# Patient Record
Sex: Female | Born: 1990 | Marital: Married
Health system: Southern US, Community
[De-identification: ages and names within clinical notes are randomized; demographics above are authoritative.]

## PROBLEM LIST (undated history)

## (undated) DIAGNOSIS — I4711 Inappropriate sinus tachycardia, so stated: Secondary | ICD-10-CM

## (undated) DIAGNOSIS — E282 Polycystic ovarian syndrome: Secondary | ICD-10-CM

## (undated) DIAGNOSIS — R000 Tachycardia, unspecified: Secondary | ICD-10-CM

## (undated) DIAGNOSIS — K529 Noninfective gastroenteritis and colitis, unspecified: Secondary | ICD-10-CM

## (undated) HISTORY — PX: WISDOM TOOTH EXTRACTION: SHX21

## (undated) HISTORY — DX: Tachycardia, unspecified: R00.0

## (undated) HISTORY — DX: Polycystic ovarian syndrome: E28.2

## (undated) HISTORY — PX: NASAL SINUS SURGERY: SHX719

## (undated) HISTORY — DX: Noninfective gastroenteritis and colitis, unspecified: K52.9

## (undated) HISTORY — DX: Inappropriate sinus tachycardia, so stated: I47.11

## (undated) HISTORY — PX: TONSILLECTOMY: SUR1361

---

## 2018-09-21 ENCOUNTER — Ambulatory Visit (INDEPENDENT_AMBULATORY_CARE_PROVIDER_SITE_OTHER): Payer: BLUE CROSS/BLUE SHIELD | Admitting: Certified Nurse Midwife

## 2018-09-21 ENCOUNTER — Encounter: Payer: Self-pay | Admitting: Certified Nurse Midwife

## 2018-09-21 ENCOUNTER — Other Ambulatory Visit (HOSPITAL_COMMUNITY)
Admission: RE | Admit: 2018-09-21 | Discharge: 2018-09-21 | Disposition: A | Discharge status: Home / Self Care | Payer: BLUE CROSS/BLUE SHIELD | Source: Ambulatory Visit | Attending: Obstetrics and Gynecology | Admitting: Obstetrics and Gynecology

## 2018-09-21 VITALS — BP 96/55 | HR 69 | Ht 60.0 in | Wt 97.5 lb

## 2018-09-21 DIAGNOSIS — Z01419 Encounter for gynecological examination (general) (routine) without abnormal findings: Secondary | ICD-10-CM | POA: Insufficient documentation

## 2018-09-21 DIAGNOSIS — R000 Tachycardia, unspecified: Secondary | ICD-10-CM | POA: Insufficient documentation

## 2018-09-21 DIAGNOSIS — I4711 Inappropriate sinus tachycardia, so stated: Secondary | ICD-10-CM | POA: Insufficient documentation

## 2018-09-21 NOTE — Patient Instructions (Signed)
Preventive Care 18-39 Years, Female Preventive care refers to lifestyle choices and visits with your health care provider that can promote health and wellness. What does preventive care include?  A yearly physical exam. This is also called an annual well check.  Dental exams once or twice a year.  Routine eye exams. Ask your health care provider how often you should have your eyes checked.  Personal lifestyle choices, including: ? Daily care of your teeth and gums. ? Regular physical activity. ? Eating a healthy diet. ? Avoiding tobacco and drug use. ? Limiting alcohol use. ? Practicing safe sex. ? Taking vitamin and mineral supplements as recommended by your health care provider. What happens during an annual well check? The services and screenings done by your health care provider during your annual well check will depend on your age, overall health, lifestyle risk factors, and family history of disease. Counseling Your health care provider may ask you questions about your:  Alcohol use.  Tobacco use.  Drug use.  Emotional well-being.  Home and relationship well-being.  Sexual activity.  Eating habits.  Work and work Statistician.  Method of birth control.  Menstrual cycle.  Pregnancy history.  Screening You may have the following tests or measurements:  Height, weight, and BMI.  Diabetes screening. This is done by checking your blood sugar (glucose) after you have not eaten for a while (fasting).  Blood pressure.  Lipid and cholesterol levels. These may be checked every 5 years starting at age 66.  Skin check.  Hepatitis C blood test.  Hepatitis B blood test.  Sexually transmitted disease (STD) testing.  BRCA-related cancer screening. This may be done if you have a family history of breast, ovarian, tubal, or peritoneal cancers.  Pelvic exam and Pap test. This may be done every 3 years starting at age 40. Starting at age 59, this may be done every 5  years if you have a Pap test in combination with an HPV test.  Discuss your test results, treatment options, and if necessary, the need for more tests with your health care provider. Vaccines Your health care provider may recommend certain vaccines, such as:  Influenza vaccine. This is recommended every year.  Tetanus, diphtheria, and acellular pertussis (Tdap, Td) vaccine. You may need a Td booster every 10 years.  Varicella vaccine. You may need this if you have not been vaccinated.  HPV vaccine. If you are 69 or younger, you may need three doses over 6 months.  Measles, mumps, and rubella (MMR) vaccine. You may need at least one dose of MMR. You may also need a second dose.  Pneumococcal 13-valent conjugate (PCV13) vaccine. You may need this if you have certain conditions and were not previously vaccinated.  Pneumococcal polysaccharide (PPSV23) vaccine. You may need one or two doses if you smoke cigarettes or if you have certain conditions.  Meningococcal vaccine. One dose is recommended if you are age 27-21 years and a first-year college student living in a residence hall, or if you have one of several medical conditions. You may also need additional booster doses.  Hepatitis A vaccine. You may need this if you have certain conditions or if you travel or work in places where you may be exposed to hepatitis A.  Hepatitis B vaccine. You may need this if you have certain conditions or if you travel or work in places where you may be exposed to hepatitis B.  Haemophilus influenzae type b (Hib) vaccine. You may need this if  you have certain risk factors.  Talk to your health care provider about which screenings and vaccines you need and how often you need them. This information is not intended to replace advice given to you by your health care provider. Make sure you discuss any questions you have with your health care provider. Document Released: 02/02/2002 Document Revised: 08/26/2016  Document Reviewed: 10/08/2015 Elsevier Interactive Patient Education  [employer].

## 2018-09-21 NOTE — Progress Notes (Signed)
GYNECOLOGY ANNUAL PREVENTATIVE CARE ENCOUNTER NOTE  Subjective:   Megan Mack is a 27 y.o. No obstetric history on file. female here for a routine annual gynecologic exam.  Current complaints: break through bleeding on current birth control pill. Would like to change to a different pill..   Denies abnormal vaginal bleeding, discharge, pelvic pain, problems with intercourse or other gynecologic concerns.    Gynecologic History Patient's last menstrual period was 08/30/2018 (approximate). Contraception: OCP (estrogen/progesterone) Last Pap: 3 yrs ago. Results were: normal per pt.  Last mammogram: N/A.   Obstetric History OB History  Gravida Para Term Preterm AB Living  0 0 0 0 0 0  SAB TAB Ectopic Multiple Live Births  0 0 0 0 0    Past Medical History:  Diagnosis Date  . Inappropriate sinus tachycardia   . PCOS (polycystic ovarian syndrome)     Past Surgical History:  Procedure Laterality Date  . NASAL SINUS SURGERY    . TONSILLECTOMY    . WISDOM TOOTH EXTRACTION      Current Outpatient Medications on File Prior to Visit  Medication Sig Dispense Refill  . cyanocobalamin (,VITAMIN B-12,) 1000 MCG/ML injection Inject into the muscle.    . diltiazem (DILACOR XR) 120 MG 24 hr capsule Take by mouth.    . levonorgestrel-ethinyl estradiol (AVIANE,ALESSE,LESSINA) 0.1-20 MG-MCG tablet Take by mouth.    . SYRINGE-NEEDLE, DISP, 3 ML 25G X 1" 3 ML MISC by Does not apply route.     No current facility-administered medications on file prior to visit.   Exercise: none No smoking/occasion alcohol/no drugs  No Known Allergies  Social History:  reports that she has never smoked. She has never used smokeless tobacco. She reports that she drinks alcohol. She reports that she does not use drugs.  Family History  Problem Relation Age of Onset  . Cancer Father   . Stroke Maternal Grandmother     The following portions of the patient's history were reviewed and updated as appropriate:  allergies, current medications, past family history, past medical history, past social history, past surgical history and problem list.  Review of Systems Pertinent items noted in HPI and remainder of comprehensive ROS otherwise negative.   Objective:  BP (!) 96/55   Pulse 69   Ht 5' (1.524 m)   Wt 97 lb 8 oz (44.2 kg)   LMP 08/30/2018 (Approximate)   BMI 19.04 kg/m  CONSTITUTIONAL: Well-developed, well-nourished female in no acute distress.  HENT:  Normocephalic, atraumatic, External right and left ear normal. Oropharynx is clear and moist EYES: Conjunctivae and EOM are normal. Pupils are equal, round, and reactive to light. No scleral icterus.  NECK: Normal range of motion, supple, no masses.  Normal thyroid.  SKIN: Skin is warm and dry. No rash noted. Not diaphoretic. No erythema. No pallor. MUSCULOSKELETAL: Normal range of motion. No tenderness.  No cyanosis, clubbing, or edema.  2+ distal pulses. NEUROLOGIC: Alert and oriented to person, place, and time. Normal reflexes, muscle tone coordination. No cranial nerve deficit noted. PSYCHIATRIC: Normal mood and affect. Normal behavior. Normal judgment and thought content. CARDIOVASCULAR: Normal heart rate noted, regular rhythm RESPIRATORY: Clear to auscultation bilaterally. Effort and breath sounds normal, no problems with respiration noted. BREASTS: Symmetric in size. No masses, skin changes, nipple drainage, or lymphadenopathy. ABDOMEN: Soft, normal bowel sounds, no distention noted.  No tenderness, rebound or guarding.  PELVIC: Normal appearing external genitalia; normal appearing vaginal mucosa and cervix.  No abnormal discharge noted.  Pap smear  obtained.  Normal uterine size, no other palpable masses, no uterine or adnexal tenderness.    Assessment and Plan:  Annual Well Women Exam Will follow up results of pap smear and manage accordingly. Mammogram not indicated Labs: has had cholesterol checked by cardiologist Encouraged  use of progestin only birth control method due to cardiac history. Discussed IUD/nexplanon/depo/ POP. She will discuss with her partner and decide.  Routine preventative health maintenance measures emphasized. Please refer to After Visit Summary for other counseling recommendations.    Doreene Burke, CNM

## 2018-09-23 LAB — CYTOLOGY - PAP: Diagnosis: NEGATIVE

## 2018-09-27 ENCOUNTER — Telehealth: Payer: Self-pay

## 2018-09-27 NOTE — Telephone Encounter (Signed)
Message left for pt to return my call re: Neg pap smear results per AT.

## 2018-09-29 ENCOUNTER — Telehealth: Payer: Self-pay

## 2018-09-29 NOTE — Telephone Encounter (Signed)
Message left on pts voicemail negative pap smear results per AT. Also requested pt call and let confirm she got this message.

## 2018-09-30 ENCOUNTER — Telehealth: Payer: Self-pay

## 2018-09-30 NOTE — Telephone Encounter (Signed)
After several unsuccessful  attempts made to contact pt by phone with negative pap smear results per AT, a "normal pap smear" letter was sent.

## 2018-10-10 ENCOUNTER — Encounter: Payer: Self-pay | Admitting: Certified Nurse Midwife

## 2018-10-10 ENCOUNTER — Ambulatory Visit (INDEPENDENT_AMBULATORY_CARE_PROVIDER_SITE_OTHER): Payer: BLUE CROSS/BLUE SHIELD | Admitting: Certified Nurse Midwife

## 2018-10-10 VITALS — BP 101/79 | HR 100 | Ht 60.0 in | Wt 96.1 lb

## 2018-10-10 DIAGNOSIS — Z30430 Encounter for insertion of intrauterine contraceptive device: Secondary | ICD-10-CM | POA: Diagnosis not present

## 2018-10-10 DIAGNOSIS — Z3202 Encounter for pregnancy test, result negative: Secondary | ICD-10-CM | POA: Diagnosis not present

## 2018-10-10 LAB — POCT URINE PREGNANCY: Preg Test, Ur: NEGATIVE

## 2018-10-10 NOTE — Progress Notes (Signed)
  GYNECOLOGY OFFICE PROCEDURE NOTE  Albertha Beattie is a 27 y.o. G0P0000 here for mirena  IUD insertion. No GYN concerns.  Last pap smear was on  and was normal.  IUD Insertion Procedure Note Patient identified, informed consent performed, consent signed.   Discussed risks of irregular bleeding, cramping, infection, malpositioning or misplacement of the IUD outside the uterus which may require further procedure such as laparoscopy. Time out was performed.  Urine pregnancy test negative.  Speculum placed in the vagina.  Cervix visualized.  Cleaned with Betadine x 2.  Grasped anteriorly with a single tooth tenaculum.  Uterus sounded to 7 cm.  Mirena IUD placed per manufacturer's recommendations.  Strings trimmed to 3 cm. Tenaculum was removed, good hemostasis noted.  Patient tolerated procedure well.   Patient was given post-procedure instructions.  She was advised to have backup contraception for one week.  Patient was also asked to check IUD strings periodically and follow up in 4 weeks for IUD check.  Doreene Burke, CNM

## 2018-10-10 NOTE — Patient Instructions (Signed)

## 2018-11-07 ENCOUNTER — Ambulatory Visit (INDEPENDENT_AMBULATORY_CARE_PROVIDER_SITE_OTHER): Payer: BLUE CROSS/BLUE SHIELD | Admitting: Certified Nurse Midwife

## 2018-11-07 ENCOUNTER — Encounter: Payer: Self-pay | Admitting: Certified Nurse Midwife

## 2018-11-07 VITALS — BP 111/61 | HR 64 | Ht 60.0 in | Wt 102.0 lb

## 2018-11-07 DIAGNOSIS — Z30431 Encounter for routine checking of intrauterine contraceptive device: Secondary | ICD-10-CM

## 2018-11-07 NOTE — Progress Notes (Signed)
GYNECOLOGY OFFICE PROGRESS NOTE  History:  27 y.o. G0P0000 here today for today for IUD string check; mirena IUD was placed 10/10/18. No complaints about the IUD, no concerning side effects.  The following portions of the patient's history were reviewed and updated as appropriate: allergies, current medications, past family history, past medical history, past social history, past surgical history and problem list. Last pap smear on 09/21/18 was normal.  Review of Systems:  Pertinent items are noted in HPI.   Objective:  Physical Exam There were no vitals taken for this visit. CONSTITUTIONAL: Well-developed, well-nourished female in no acute distress.  HENT:  Normocephalic, atraumatic. External right and left ear normal. Oropharynx is clear and moist EYES: Conjunctivae and EOM are normal No scleral icterus.  NECK: Normal range of motion, supple, no masses CARDIOVASCULAR: Normal heart rate noted RESPIRATORY: Effort and breath sounds normal, no problems with respiration noted ABDOMEN: Soft, no distention noted.   PELVIC: Normal appearing external genitalia; normal appearing vaginal mucosa and cervix.  IUD strings visualized, about 3 cm in length outside cervix.   Assessment & Plan:  Normal IUD check. Patient to keep IUD in place for five years; can come in for removal if she desires pregnancy within the next five years. Routine preventative health maintenance measures emphasized.   Doreene BurkeAnnie Amanada Philbrick, CNM

## 2018-11-07 NOTE — Patient Instructions (Signed)
Preventive Care 18-39 Years, Female Preventive care refers to lifestyle choices and visits with your health care provider that can promote health and wellness. What does preventive care include?  A yearly physical exam. This is also called an annual well check.  Dental exams once or twice a year.  Routine eye exams. Ask your health care provider how often you should have your eyes checked.  Personal lifestyle choices, including: ? Daily care of your teeth and gums. ? Regular physical activity. ? Eating a healthy diet. ? Avoiding tobacco and drug use. ? Limiting alcohol use. ? Practicing safe sex. ? Taking vitamin and mineral supplements as recommended by your health care provider. What happens during an annual well check? The services and screenings done by your health care provider during your annual well check will depend on your age, overall health, lifestyle risk factors, and family history of disease. Counseling Your health care provider may ask you questions about your:  Alcohol use.  Tobacco use.  Drug use.  Emotional well-being.  Home and relationship well-being.  Sexual activity.  Eating habits.  Work and work Statistician.  Method of birth control.  Menstrual cycle.  Pregnancy history.  Screening You may have the following tests or measurements:  Height, weight, and BMI.  Diabetes screening. This is done by checking your blood sugar (glucose) after you have not eaten for a while (fasting).  Blood pressure.  Lipid and cholesterol levels. These may be checked every 5 years starting at age 66.  Skin check.  Hepatitis C blood test.  Hepatitis B blood test.  Sexually transmitted disease (STD) testing.  BRCA-related cancer screening. This may be done if you have a family history of breast, ovarian, tubal, or peritoneal cancers.  Pelvic exam and Pap test. This may be done every 3 years starting at age 40. Starting at age 59, this may be done every 5  years if you have a Pap test in combination with an HPV test.  Discuss your test results, treatment options, and if necessary, the need for more tests with your health care provider. Vaccines Your health care provider may recommend certain vaccines, such as:  Influenza vaccine. This is recommended every year.  Tetanus, diphtheria, and acellular pertussis (Tdap, Td) vaccine. You may need a Td booster every 10 years.  Varicella vaccine. You may need this if you have not been vaccinated.  HPV vaccine. If you are 69 or younger, you may need three doses over 6 months.  Measles, mumps, and rubella (MMR) vaccine. You may need at least one dose of MMR. You may also need a second dose.  Pneumococcal 13-valent conjugate (PCV13) vaccine. You may need this if you have certain conditions and were not previously vaccinated.  Pneumococcal polysaccharide (PPSV23) vaccine. You may need one or two doses if you smoke cigarettes or if you have certain conditions.  Meningococcal vaccine. One dose is recommended if you are age 27-21 years and a first-year college student living in a residence hall, or if you have one of several medical conditions. You may also need additional booster doses.  Hepatitis A vaccine. You may need this if you have certain conditions or if you travel or work in places where you may be exposed to hepatitis A.  Hepatitis B vaccine. You may need this if you have certain conditions or if you travel or work in places where you may be exposed to hepatitis B.  Haemophilus influenzae type b (Hib) vaccine. You may need this if  you have certain risk factors.  Talk to your health care provider about which screenings and vaccines you need and how often you need them. This information is not intended to replace advice given to you by your health care provider. Make sure you discuss any questions you have with your health care provider. Document Released: 02/02/2002 Document Revised: 08/26/2016  Document Reviewed: 10/08/2015 Elsevier Interactive Patient Education  [employer].

## 2019-05-01 ENCOUNTER — Other Ambulatory Visit (HOSPITAL_COMMUNITY)
Admission: RE | Admit: 2019-05-01 | Discharge: 2019-05-01 | Disposition: A | Discharge status: Home / Self Care | Payer: BLUE CROSS/BLUE SHIELD | Source: Ambulatory Visit | Attending: Certified Nurse Midwife | Admitting: Certified Nurse Midwife

## 2019-05-01 ENCOUNTER — Other Ambulatory Visit: Payer: Self-pay

## 2019-05-01 ENCOUNTER — Ambulatory Visit (INDEPENDENT_AMBULATORY_CARE_PROVIDER_SITE_OTHER): Payer: BLUE CROSS/BLUE SHIELD | Admitting: Certified Nurse Midwife

## 2019-05-01 ENCOUNTER — Encounter: Payer: Self-pay | Admitting: Certified Nurse Midwife

## 2019-05-01 VITALS — BP 99/59 | HR 82 | Ht 60.0 in | Wt 95.7 lb

## 2019-05-01 DIAGNOSIS — N898 Other specified noninflammatory disorders of vagina: Secondary | ICD-10-CM | POA: Insufficient documentation

## 2019-05-01 NOTE — Progress Notes (Signed)
GYN ENCOUNTER NOTE  Subjective:       Megan Mack is a 28 y.o. G0P0000 female is here for gynecologic evaluation of the following issues:  1. Low libido and vaginal itching. Pt states she has history of veganisms and went to PT for it she states she has always had low libido. Her mother, sister and bother also have this problem. The vaginal itching has been present for the last month. She denies new partners, odor, or increase in d/c. She has noticed her breast tissue has become floppy since IUD.    Gynecologic History No LMP recorded. (Menstrual status: IUD). Contraception: IUD Last Pap: 09/21/18. Results were: normal Last mammogram: n/a.   Obstetric History OB History  Gravida Para Term Preterm AB Living  0 0 0 0 0 0  SAB TAB Ectopic Multiple Live Births  0 0 0 0 0    Past Medical History:  Diagnosis Date  . Inappropriate sinus tachycardia   . PCOS (polycystic ovarian syndrome)     Past Surgical History:  Procedure Laterality Date  . NASAL SINUS SURGERY    . TONSILLECTOMY    . WISDOM TOOTH EXTRACTION      Current Outpatient Medications on File Prior to Visit  Medication Sig Dispense Refill  . cyanocobalamin (,VITAMIN B-12,) 1000 MCG/ML injection Inject into the muscle.    . levonorgestrel (MIRENA) 20 MCG/24HR IUD 1 each by Intrauterine route once.    . SYRINGE-NEEDLE, DISP, 3 ML 25G X 1" 3 ML MISC by Does not apply route.    . diltiazem (DILACOR XR) 120 MG 24 hr capsule Take by mouth.     No current facility-administered medications on file prior to visit.     No Known Allergies  Social History   Socioeconomic History  . Marital status: Married    Spouse name: Not on file  . Number of children: Not on file  . Years of education: Not on file  . Highest education level: Not on file  Occupational History  . Not on file  Social Needs  . Financial resource strain: Not on file  . Food insecurity:    Worry: Not on file    Inability: Not on file  . Transportation  needs:    Medical: Not on file    Non-medical: Not on file  Tobacco Use  . Smoking status: Never Smoker  . Smokeless tobacco: Never Used  Substance and Sexual Activity  . Alcohol use: Yes    Comment: occas  . Drug use: Never  . Sexual activity: Yes    Birth control/protection: I.U.D.    Comment: mirena  Lifestyle  . Physical activity:    Days per week: Not on file    Minutes per session: Not on file  . Stress: Not on file  Relationships  . Social connections:    Talks on phone: Not on file    Gets together: Not on file    Attends religious service: Not on file    Active member of club or organization: Not on file    Attends meetings of clubs or organizations: Not on file    Relationship status: Not on file  . Intimate partner violence:    Fear of current or ex partner: Not on file    Emotionally abused: Not on file    Physically abused: Not on file    Forced sexual activity: Not on file  Other Topics Concern  . Not on file  Social History Narrative  .  Not on file    Family History  Problem Relation Age of Onset  . Cancer Father   . Stroke Maternal Grandmother     The following portions of the patient's history were reviewed and updated as appropriate: allergies, current medications, past family history, past medical history, past social history, past surgical history and problem list.  Review of Systems Review of Systems - Negative except as mentioned in HPI Review of Systems - General ROS: negative for - chills, fatigue, fever, hot flashes, malaise or night sweats Hematological and Lymphatic ROS: negative for - bleeding problems or swollen lymph nodes Gastrointestinal ROS: negative for - abdominal pain, blood in stools, change in bowel habits and nausea/vomiting Musculoskeletal ROS: negative for - joint pain, muscle pain or muscular weakness Genito-Urinary ROS: negative for - change in menstrual cycle, dysmenorrhea, dyspareunia, dysuria, genital discharge, genital  ulcers, hematuria, incontinence, irregular/heavy menses, nocturia or pelvic pain  Objective:   BP (!) 99/59   Pulse 82   Ht 5' (1.524 m)   Wt 95 lb 11.2 oz (43.4 kg)   BMI 18.69 kg/m  CONSTITUTIONAL: Well-developed, well-nourished female in no acute distress.  HENT:  Normocephalic, atraumatic.  NECK: Normal range of motion, supple, no masses.  Normal thyroid.  SKIN: Skin is warm and dry. No rash noted. Not diaphoretic. No erythema. No pallor. NEUROLGIC: Alert and oriented to person, place, and time. PSYCHIATRIC: Normal mood and affect. Normal behavior. Normal judgment and thought content. CARDIOVASCULAR:Not Examined RESPIRATORY: Not Examined BREASTS: Not Examined ABDOMEN: Soft, non distended; Non tender.  No Organomegaly. PELVIC:  External Genitalia: Normal  BUS: Normal  Vagina: Normal  Cervix: Normal, white particulate discharge , perineal area has areas of cracking.  MUSCULOSKELETAL: Normal range of motion. No tenderness.  No cyanosis, clubbing, or edema.  Assessment:   1. Vaginal itching - Cervicovaginal ancillary only   Low libido    Plan:   Discussed use of toys, books, videos, foreplay to increase arousal. Discussed use of medications: estrogen 0.5mg  daily x 3 wks ( off one wk) or Vyleesi. She will discuss it with her partner and let me know if she would like to proceed with either treatment option. Vaginal swab today. Will follow up with results. If negative. Discussed use of steroid cream to help with itching. She verbalizes and agrees to plan of care. Follow up PRN.   Doreene BurkeAnnie Darriel Utter, CNM

## 2019-05-01 NOTE — Patient Instructions (Signed)
Vaginitis  Vaginitis is a condition in which the vaginal tissue swells and becomes red (inflamed). This condition is most often caused by a change in the normal balance of bacteria and yeast that live in the vagina. This change causes an overgrowth of certain bacteria or yeast, which causes the inflammation. There are different types of vaginitis, but the most common types are:   Bacterial vaginosis.   Yeast infection (candidiasis).   Trichomoniasis vaginitis. This is a sexually transmitted disease (STD).   Viral vaginitis.   Atrophic vaginitis.   Allergic vaginitis.  What are the causes?  The cause of this condition depends on the type of vaginitis. It can be caused by:   Bacteria (bacterial vaginosis).   Yeast, which is a fungus (yeast infection).   A parasite (trichomoniasis vaginitis).   A virus (viral vaginitis).   Low hormone levels (atrophic vaginitis). Low hormone levels can occur during pregnancy, breastfeeding, or after menopause.   Irritants, such as bubble baths, scented tampons, and feminine sprays (allergic vaginitis).  Other factors can change the normal balance of the yeast and bacteria that live in the vagina. These include:   Antibiotic medicines.   Poor hygiene.   Diaphragms, vaginal sponges, spermicides, birth control pills, and intrauterine devices (IUD).   Sex.   Infection.   Uncontrolled diabetes.   A weakened defense (immune) system.  What increases the risk?  This condition is more likely to develop in women who:   Smoke.   Use vaginal douches, scented tampons, or scented sanitary pads.   Wear tight-fitting pants.   Wear thong underwear.   Use oral birth control pills or an IUD.   Have sex without a condom.   Have multiple sex partners.   Have an STD.   Frequently use the spermicide nonoxynol-9.   Eat lots of foods high in sugar.   Have uncontrolled diabetes.   Have low estrogen levels.   Have a weakened immune system from an immune disorder or medical  treatment.   Are pregnant or breastfeeding.  What are the signs or symptoms?  Symptoms vary depending on the cause of the vaginitis. Common symptoms include:   Abnormal vaginal discharge.  ? The discharge is white, gray, or yellow with bacterial vaginosis.  ? The discharge is thick, white, and cheesy with a yeast infection.  ? The discharge is frothy and yellow or greenish with trichomoniasis.   A bad vaginal smell. The smell is fishy with bacterial vaginosis.   Vaginal itching, pain, or swelling.   Sex that is painful.   Pain or burning when urinating.  Sometimes there are no symptoms.  How is this diagnosed?  This condition is diagnosed based on your symptoms and medical history. A physical exam, including a pelvic exam, will also be done. You may also have other tests, including:   Tests to determine the pH level (acidity or alkalinity) of your vagina.   A whiff test, to assess the odor that results when a sample of your vaginal discharge is mixed with a potassium hydroxide solution.   Tests of vaginal fluid. A sample will be examined under a microscope.  How is this treated?  Treatment varies depending on the type of vaginitis you have. Your treatment may include:   Antibiotic creams or pills to treat bacterial vaginosis and trichomoniasis.   Antifungal medicines, such as vaginal creams or suppositories, to treat a yeast infection.   Medicine to ease discomfort if you have viral vaginitis. Your sexual partner   should also be treated.   Estrogen delivered in a cream, pill, suppository, or vaginal ring to treat atrophic vaginitis. If vaginal dryness occurs, lubricants and moisturizing creams may help. You may need to avoid scented soaps, sprays, or douches.   Stopping use of a product that is causing allergic vaginitis. Then using a vaginal cream to treat the symptoms.  Follow these instructions at home:  Lifestyle   Keep your genital area clean and dry. Avoid soap, and only rinse the area with  water.   Do not douche or use tampons until your health care provider says it is okay to do so. Use sanitary pads, if needed.   Do not have sex until your health care provider approves. When you can return to sex, practice safe sex and use condoms.   Wipe from front to back. This avoids the spread of bacteria from the rectum to the vagina.  General instructions   Take over-the-counter and prescription medicines only as told by your health care provider.   If you were prescribed an antibiotic medicine, take or use it as told by your health care provider. Do not stop taking or using the antibiotic even if you start to feel better.   Keep all follow-up visits as told by your health care provider. This is important.  How is this prevented?   Use mild, non-scented products. Do not use things that can irritate the vagina, such as fabric softeners. Avoid the following products if they are scented:  ? Feminine sprays.  ? Detergents.  ? Tampons.  ? Feminine hygiene products.  ? Soaps or bubble baths.   Let air reach your genital area.  ? Wear cotton underwear to reduce moisture buildup.  ? Avoid wearing underwear while you sleep.  ? Avoid wearing tight pants and underwear or nylons without a cotton panel.  ? Avoid wearing thong underwear.   Take off any wet clothing, such as bathing suits, as soon as possible.   Practice safe sex and use condoms.  Contact a health care provider if:   You have abdominal pain.   You have a fever.   You have symptoms that last for more than 2-3 days.  Get help right away if:   You have a fever and your symptoms suddenly get worse.  Summary   Vaginitis is a condition in which the vaginal tissue becomes inflamed.This condition is most often caused by a change in the normal balance of bacteria and yeast that live in the vagina.   Treatment varies depending on the type of vaginitis you have.   Do not douche, use tampons , or have sex until your health care provider approves. When  you can return to sex, practice safe sex and use condoms.  This information is not intended to replace advice given to you by your health care provider. Make sure you discuss any questions you have with your health care provider.  Document Released: 10/04/2007 Document Revised: 01/12/2017 Document Reviewed: 01/12/2017  Elsevier Interactive Patient Education  2019 [employer]

## 2019-05-03 LAB — CERVICOVAGINAL ANCILLARY ONLY
Bacterial vaginitis: NEGATIVE
Candida vaginitis: NEGATIVE

## 2019-05-05 ENCOUNTER — Other Ambulatory Visit: Payer: Self-pay | Admitting: Certified Nurse Midwife

## 2019-05-05 MED ORDER — TRIAMCINOLONE ACETONIDE 0.1 % EX CREA: 1.0000 "application " | TOPICAL_CREAM | Freq: Two times a day (BID) | CUTANEOUS | 0 refills | Status: DC | PRN

## 2019-05-05 NOTE — Progress Notes (Signed)
Vaginal swab negative. Orders placed for Kenalog cream as discussed at visit. Pt notified.    Doreene Burke, CNM

## 2020-01-16 ENCOUNTER — Other Ambulatory Visit: Payer: Self-pay

## 2020-01-16 ENCOUNTER — Encounter: Payer: Self-pay | Admitting: Certified Nurse Midwife

## 2020-01-16 ENCOUNTER — Ambulatory Visit (INDEPENDENT_AMBULATORY_CARE_PROVIDER_SITE_OTHER): Payer: Managed Care, Other (non HMO) | Admitting: Certified Nurse Midwife

## 2020-01-16 VITALS — BP 102/66 | HR 83 | Ht 60.0 in | Wt 103.1 lb

## 2020-01-16 DIAGNOSIS — Z13220 Encounter for screening for lipoid disorders: Secondary | ICD-10-CM | POA: Diagnosis not present

## 2020-01-16 DIAGNOSIS — Z01419 Encounter for gynecological examination (general) (routine) without abnormal findings: Secondary | ICD-10-CM | POA: Diagnosis not present

## 2020-01-16 DIAGNOSIS — Z136 Encounter for screening for cardiovascular disorders: Secondary | ICD-10-CM

## 2020-01-16 MED ORDER — TRIAMCINOLONE ACETONIDE 0.1 % EX CREA: 1.0000 "application " | TOPICAL_CREAM | Freq: Two times a day (BID) | CUTANEOUS | 0 refills | Status: DC | PRN

## 2020-01-16 NOTE — Patient Instructions (Signed)
Preventive Care 21-28 Years Old, Female Preventive care refers to visits with your health care provider and lifestyle choices that can promote health and wellness. This includes:  A yearly physical exam. This may also be called an annual well check.  Regular dental visits and eye exams.  Immunizations.  Screening for certain conditions.  Healthy lifestyle choices, such as eating a healthy diet, getting regular exercise, not using drugs or products that contain nicotine and tobacco, and limiting alcohol use. What can I expect for my preventive care visit? Physical exam Your health care provider will check your:  Height and weight. This may be used to calculate body mass index (BMI), which tells if you are at a healthy weight.  Heart rate and blood pressure.  Skin for abnormal spots. Counseling Your health care provider may ask you questions about your:  Alcohol, tobacco, and drug use.  Emotional well-being.  Home and relationship well-being.  Sexual activity.  Eating habits.  Work and work environment.  Method of birth control.  Menstrual cycle.  Pregnancy history. What immunizations do I need?  Influenza (flu) vaccine  This is recommended every year. Tetanus, diphtheria, and pertussis (Tdap) vaccine  You may need a Td booster every 10 years. Varicella (chickenpox) vaccine  You may need this if you have not been vaccinated. Human papillomavirus (HPV) vaccine  If recommended by your health care provider, you may need three doses over 6 months. Measles, mumps, and rubella (MMR) vaccine  You may need at least one dose of MMR. You may also need a second dose. Meningococcal conjugate (MenACWY) vaccine  One dose is recommended if you are age 19-21 years and a first-year college student living in a residence hall, or if you have one of several medical conditions. You may also need additional booster doses. Pneumococcal conjugate (PCV13) vaccine  You may need  this if you have certain conditions and were not previously vaccinated. Pneumococcal polysaccharide (PPSV23) vaccine  You may need one or two doses if you smoke cigarettes or if you have certain conditions. Hepatitis A vaccine  You may need this if you have certain conditions or if you travel or work in places where you may be exposed to hepatitis A. Hepatitis B vaccine  You may need this if you have certain conditions or if you travel or work in places where you may be exposed to hepatitis B. Haemophilus influenzae type b (Hib) vaccine  You may need this if you have certain conditions. You may receive vaccines as individual doses or as more than one vaccine together in one shot (combination vaccines). Talk with your health care provider about the risks and benefits of combination vaccines. What tests do I need?  Blood tests  Lipid and cholesterol levels. These may be checked every 5 years starting at age 20.  Hepatitis C test.  Hepatitis B test. Screening  Diabetes screening. This is done by checking your blood sugar (glucose) after you have not eaten for a while (fasting).  Sexually transmitted disease (STD) testing.  BRCA-related cancer screening. This may be done if you have a family history of breast, ovarian, tubal, or peritoneal cancers.  Pelvic exam and Pap test. This may be done every 3 years starting at age 21. Starting at age 30, this may be done every 5 years if you have a Pap test in combination with an HPV test. Talk with your health care provider about your test results, treatment options, and if necessary, the need for more tests.   Follow these instructions at home: Eating and drinking   Eat a diet that includes fresh fruits and vegetables, whole grains, lean protein, and low-fat dairy.  Take vitamin and mineral supplements as recommended by your health care provider.  Do not drink alcohol if: ? Your health care provider tells you not to drink. ? You are  pregnant, may be pregnant, or are planning to become pregnant.  If you drink alcohol: ? Limit how much you have to 0-1 drink a day. ? Be aware of how much alcohol is in your drink. In the [place] one drink equals one 12 oz bottle of beer (355 mL), one 5 oz glass of wine (148 mL), or one 1 oz glass of hard liquor (44 mL). Lifestyle  Take daily care of your teeth and gums.  Stay active. Exercise for at least 30 minutes on 5 or more days each week.  Do not use any products that contain nicotine or tobacco, such as cigarettes, e-cigarettes, and chewing tobacco. If you need help quitting, ask your health care provider.  If you are sexually active, practice safe sex. Use a condom or other form of birth control (contraception) in order to prevent pregnancy and STIs (sexually transmitted infections). If you plan to become pregnant, see your health care provider for a preconception visit. What's next?  Visit your health care provider once a year for a well check visit.  Ask your health care provider how often you should have your eyes and teeth checked.  Stay up to date on all vaccines. This information is not intended to replace advice given to you by your health care provider. Make sure you discuss any questions you have with your health care provider. Document Revised: 08/18/2018 Document Reviewed: 08/18/2018 Elsevier Patient Education  2020 [employer].

## 2020-01-16 NOTE — Progress Notes (Signed)
GYNECOLOGY ANNUAL PREVENTATIVE CARE ENCOUNTER NOTE  History:     Megan Mack is a 29 y.o. G0P0000 female here for a routine annual gynecologic exam.  Current complaints: none   Denies abnormal vaginal bleeding, discharge, pelvic pain, problems with intercourse or other gynecologic concerns.     Married/heterosexual No children, lives with husband Works as a Surveyor, minerals Exercise 5x wk x 20 in (bike) Denies smoking and drug use, occasional alcholhol   Gynecologic History No LMP recorded. (Menstrual status: IUD). Contraception: IUD Last Pap: 09/21/2018. Results were: normal  Last mammogram: n/a   Obstetric History OB History  Gravida Para Term Preterm AB Living  0 0 0 0 0 0  SAB TAB Ectopic Multiple Live Births  0 0 0 0 0    Past Medical History:  Diagnosis Date  . Inappropriate sinus tachycardia   . PCOS (polycystic ovarian syndrome)     Past Surgical History:  Procedure Laterality Date  . NASAL SINUS SURGERY    . TONSILLECTOMY    . WISDOM TOOTH EXTRACTION      Current Outpatient Medications on File Prior to Visit  Medication Sig Dispense Refill  . cyanocobalamin (,VITAMIN B-12,) 1000 MCG/ML injection Inject into the muscle.    . levonorgestrel (MIRENA) 20 MCG/24HR IUD 1 each by Intrauterine route once.    . metoprolol tartrate (LOPRESSOR) 25 MG tablet Take by mouth.    . SYRINGE-NEEDLE, DISP, 3 ML 25G X 1" 3 ML MISC by Does not apply route.    . triamcinolone cream (KENALOG) 0.1 % Apply 1 application topically 2 (two) times daily as needed. 15 g 0  . diltiazem (DILACOR XR) 120 MG 24 hr capsule Take by mouth.     No current facility-administered medications on file prior to visit.    No Known Allergies  Social History:  reports that she has never smoked. She has never used smokeless tobacco. She reports current alcohol use. She reports that she does not use drugs.  Family History  Problem Relation Age of Onset  . Cancer Father   . Stroke Maternal Grandmother      The following portions of the patient's history were reviewed and updated as appropriate: allergies, current medications, past family history, past medical history, past social history, past surgical history and problem list.  Review of Systems Pertinent items noted in HPI and remainder of comprehensive ROS otherwise negative.  Physical Exam:  BP 102/66   Pulse 83   Ht 5' (1.524 m)   Wt 103 lb 2 oz (46.8 kg)   BMI 20.14 kg/m  CONSTITUTIONAL: Well-developed, well-nourished female in no acute distress.  HENT:  Normocephalic, atraumatic, External right and left ear normal. Oropharynx is clear and moist EYES: Conjunctivae and EOM are normal. Pupils are equal, round, and reactive to light. No scleral icterus.  NECK: Normal range of motion, supple, no masses.  Normal thyroid.  SKIN: Skin is warm and dry. No rash noted. Not diaphoretic. No erythema. No pallor. MUSCULOSKELETAL: Normal range of motion. No tenderness.  No cyanosis, clubbing, or edema.  2+ distal pulses. NEUROLOGIC: Alert and oriented to person, place, and time. Normal reflexes, muscle tone coordination.  PSYCHIATRIC: Normal mood and affect. Normal behavior. Normal judgment and thought content. CARDIOVASCULAR: Normal heart rate noted, regular rhythm RESPIRATORY: Clear to auscultation bilaterally. Effort and breath sounds normal, no problems with respiration noted. BREASTS: Symmetric in size. No masses, tenderness, skin changes, nipple drainage, or lymphadenopathy bilaterally. ABDOMEN: Soft, no distention noted.  No tenderness,  rebound or guarding.  PELVIC: Normal appearing external genitalia and urethral meatus; normal appearing vaginal mucosa and cervix.  No abnormal discharge noted.  Pap smear not indicated. Strings present  Normal uterine size, no other palpable masses, no uterine or adnexal tenderness.   Assessment and Plan:    1. Women's annual routine gynecological examination  Pap smear not due until 2022 Mammogram  N/A Labs: lipid profile Refill: kenolog cream Routine preventative health maintenance measures emphasized. Please refer to After Visit Summary for other counseling recommendations.     Doreene Burke, CNM

## 2020-01-17 LAB — LIPID PANEL
Chol/HDL Ratio: 2.9 ratio (ref 0.0–4.4)
Cholesterol, Total: 163 mg/dL (ref 100–199)
HDL: 56 mg/dL (ref >39)
LDL Chol Calc (NIH): 96 mg/dL (ref 0–99)
Triglycerides: 52 mg/dL (ref 0–149)
VLDL Cholesterol Cal: 11 mg/dL (ref 5–40)

## 2020-04-26 ENCOUNTER — Telehealth: Payer: Self-pay

## 2020-04-26 NOTE — Telephone Encounter (Signed)
Patient has new patient appointment with Marvell Fuller.

## 2020-04-26 NOTE — Telephone Encounter (Signed)
Copied from CRM [id number]. Topic: Appointment Scheduling - Scheduling Inquiry for Clinic >> Apr 26, 2020  4:16 PM Leary Roca wrote: Reason for CRM: Pt is requesting new pt appt with Dr B first or any provider . Please advise

## 2020-05-02 ENCOUNTER — Telehealth: Payer: Self-pay

## 2020-05-02 NOTE — Telephone Encounter (Signed)
Chart was reviewed. New patient visit best in person.

## 2020-05-02 NOTE — Telephone Encounter (Signed)
Megan Mack this is for a new patient appointment   Copied from CRM [id number]. Topic: General - Other >> May 02, 2020  3:40 PM [name] wrote: Reason for CRM: Patient husband called to request a virtual visit for the appointment that is scheduled for 05/03/20 to be a virtual visit since they are sceptical about leaving the house at this time. Please advise Ph# [phone]

## 2020-05-03 ENCOUNTER — Ambulatory Visit: Payer: Self-pay | Admitting: Adult Health

## 2020-05-03 NOTE — Telephone Encounter (Signed)
Spoke with husband who stated that he and patient were diagnosed with scabies a week ago and are staying quarentend for a week and would like to schedule appointment virtually. After speaking with Megan Mack she would like to keep appointment as in person. KW

## 2020-05-03 NOTE — Telephone Encounter (Signed)
appt has been moved to 05/16/20 at 8AM. KW

## 2020-05-16 ENCOUNTER — Ambulatory Visit: Payer: Self-pay | Admitting: Adult Health

## 2020-12-04 ENCOUNTER — Encounter: Payer: Self-pay | Admitting: Certified Nurse Midwife

## 2020-12-04 ENCOUNTER — Ambulatory Visit (INDEPENDENT_AMBULATORY_CARE_PROVIDER_SITE_OTHER): Payer: Managed Care, Other (non HMO) | Admitting: Certified Nurse Midwife

## 2020-12-04 ENCOUNTER — Other Ambulatory Visit: Payer: Self-pay

## 2020-12-04 VITALS — BP 89/63 | HR 64 | Ht 60.0 in | Wt 107.4 lb

## 2020-12-04 DIAGNOSIS — Z30432 Encounter for removal of intrauterine contraceptive device: Secondary | ICD-10-CM | POA: Diagnosis not present

## 2020-12-04 NOTE — Patient Instructions (Signed)
Preparing for Pregnancy °If you are considering becoming pregnant, make an appointment to see your regular health care provider to learn how to prepare for a safe and healthy pregnancy (preconception care). During a preconception care visit, your health care provider will: °· Do a complete physical exam, including a Pap test. °· Take a complete medical history. °· Give you information, answer your questions, and help you resolve problems. °Preconception checklist °Medical history °· Tell your health care provider about any current or past medical conditions. Your pregnancy or your ability to become pregnant may be affected by chronic conditions, such as diabetes, chronic hypertension, and thyroid problems. °· Include your family's medical history as well as your partner's medical history. °· Tell your health care provider about any history of STIs (sexually transmitted infections). These can affect your pregnancy. In some cases, they can be passed to your baby. Discuss any concerns that you have about STIs. °· If indicated, discuss the benefits of genetic testing. This testing will show whether there are any genetic conditions that may be passed from you or your partner to your baby. °· Tell your health care provider about: °? Any problems you have had with conception or pregnancy. °? Any medicines you take. These include vitamins, herbal supplements, and over-the-counter medicines. °? Your history of immunizations. Discuss any vaccinations that you may need. °Diet °· Ask your health care provider what to include in a healthy diet that has a balance of nutrients. This is especially important when you are pregnant or preparing to become pregnant. °· Ask your health care provider to help you reach a healthy weight before pregnancy. °? If you are overweight, you may be at higher risk for certain complications, such as high blood pressure, diabetes, and preterm birth. °? If you are underweight, you are more likely to  have a baby who has a low birth weight. °Lifestyle, work, and home °· Let your health care provider know: °? About any lifestyle habits that you have, such as alcohol use, drug use, or smoking. °? About recreational activities that may put you at risk during pregnancy, such as downhill skiing and certain exercise programs. °? Tell your health care provider about any international travel, especially any travel to places with an active Zika virus outbreak. °? About harmful substances that you may be exposed to at work or at home. These include chemicals, pesticides, radiation, or even litter boxes. °? If you do not feel safe at home. °Mental health °· Tell your health care provider about: °? Any history of mental health conditions, including feelings of depression, sadness, or anxiety. °? Any medicines that you take for a mental health condition. These include herbs and supplements. °Home instructions to prepare for pregnancy °Lifestyle ° °· Eat a balanced diet. This includes fresh fruits and vegetables, whole grains, lean meats, low-fat dairy products, healthy fats, and foods that are high in fiber. Ask to meet with a nutritionist or registered dietitian for assistance with meal planning and goals. °· Get regular exercise. Try to be active for at least 30 minutes a day on most days of the week. Ask your health care provider which activities are safe during pregnancy. °· Do not use any products that contain nicotine or tobacco, such as cigarettes and e-cigarettes. If you need help quitting, ask your health care provider. °· Do not drink alcohol. °· Do not take illegal drugs. °· Maintain a healthy weight. Ask your health care provider what weight range is right for you. °General   instructions °· Keep an accurate record of your menstrual periods. This makes it easier for your health care provider to determine your baby's due date. °· Begin taking prenatal vitamins and folic acid supplements daily as directed by your  health care provider. °· Manage any chronic conditions, such as high blood pressure and diabetes, as told by your health care provider. This is important. °How do I know that I am pregnant? °You may be pregnant if you have been sexually active and you miss your period. Symptoms of early pregnancy include: °· Mild cramping. °· Very light vaginal bleeding (spotting). °· Feeling unusually tired. °· Nausea and vomiting (morning sickness). °If you have any of these symptoms and you suspect that you might be pregnant, you can take a home pregnancy test. These tests check for a hormone in your urine (human chorionic gonadotropin, or hCG). A woman's body begins to make this hormone during early pregnancy. These tests are very accurate. Wait until at least the first day after you miss your period to take one. If the test shows that you are pregnant (you get a positive result), call your health care provider to make an appointment for prenatal care. °What should I do if I become pregnant? ° °  ° °· Make an appointment with your health care provider as soon as you suspect you are pregnant. °· Do not use any products that contain nicotine, such as cigarettes, chewing tobacco, and e-cigarettes. If you need help quitting, ask your health care provider. °· Do not drink alcoholic beverages. Alcohol is related to a number of birth defects. °· Avoid toxic odors and chemicals. °· You may continue to have sexual intercourse if it does not cause pain or other problems, such as vaginal bleeding. °This information is not intended to replace advice given to you by your health care provider. Make sure you discuss any questions you have with your health care provider. °Document Revised: 12/09/2017 Document Reviewed: 06/28/2016 °Elsevier Patient Education © 2020 [employer] Inc[employer] ° °

## 2020-12-04 NOTE — Progress Notes (Signed)
    GYNECOLOGY OFFICE PROCEDURE NOTE  Megan Mack is a 29 y.o. G0P0000 here for IUD removal. No GYN concerns.  Last pap smear was on 09/21/2018 and was normal.  IUD Removal  Patient identified, informed consent performed, consent signed.  Patient was in the dorsal lithotomy position, normal external genitalia was noted.  A speculum was placed in the patient's vagina, normal discharge was noted, no lesions. The cervix was visualized, no lesions, no abnormal discharge.  The strings of the IUD were grasped and pulled using ring forceps. The IUD was removed in its entirety.  Patient tolerated the procedure well.    Patient plans for pregnancy soon and she was told to avoid teratogens, take PNV and folate.  Routine preventative health maintenance measures emphasized.   Doreene Burke, CNM

## 2020-12-21 NOTE — L&D Delivery Note (Signed)
       Delivery Note   Megan Mack is a 30 y.o. G1P0000 at 101w1d Estimated Date of Delivery: 11/11/21  PRE-OPERATIVE DIAGNOSIS:  1) [redacted]w[redacted]d pregnancy.    POST-OPERATIVE DIAGNOSIS:  1) [redacted]w[redacted]d pregnancy s/p Vacuum Assisted    Delivery Type: Vaginal, Vacuum assisted   Delivery Anesthesia: Epidural   Labor Complications:  none    ESTIMATED BLOOD LOSS: 270 ml    FINDINGS:   1) female infant, Apgar scores of 9   at 1 minute and 10   at 5 minutes and  birthweight pending , infant skin to skin.  2) Nuchal cord: no  SPECIMENS:   PLACENTA:   Appearance: Intact , 3 vessel cord, cord blood sample    Removal: Spontaneous      Disposition:  per protocol   DISPOSITION:  Infant to left in stable condition in the delivery room, with L&D personnel and mother,  NARRATIVE SUMMARY: Labor course:  Ms. Megan Mack is a G1P0000 at [redacted]w[redacted]d who presented for induction of labor.  She progressed well in labor with pitocin.  She received the appropriate epidural anesthesia and proceeded to complete dilation. She evidenced poor maternal expulsive effort during the second stage. After pushing x 1 hr fetal heart with deep variable declarations. Dr. Valentino Saxon consulted on strip. Fetal head at plus 2 station. Discussed use of Vacuum with Dr. Valentino Saxon, she is in agreement of use. Reviewed with pt and her partner risks and benefits of use . They both consent to use of kiwi Vacuum to assist with delivery. Kiwi Vacuum applied to fetal scalp, suction applied with pushing, suction of kiwi released  in between pushes. Vacuum used for 2 pushes , no pops off. She went on to deliver a viable female infant " Rolland Bimler". The placenta delivered without problems and was noted to be complete. A perineal and vaginal examination was performed. Lacerations: Right sulcus and 2nd degree  perineal lacerations was repaired with 3-0 Vicryl Rapide suture. The patient tolerated this well.  Doreene Burke, CNM  11/19/2021 10:00 PM

## 2021-01-21 ENCOUNTER — Encounter: Payer: Managed Care, Other (non HMO) | Admitting: Certified Nurse Midwife

## 2021-03-25 ENCOUNTER — Encounter: Payer: Self-pay | Admitting: Certified Nurse Midwife

## 2021-03-25 ENCOUNTER — Ambulatory Visit (INDEPENDENT_AMBULATORY_CARE_PROVIDER_SITE_OTHER): Payer: Managed Care, Other (non HMO) | Admitting: Certified Nurse Midwife

## 2021-03-25 ENCOUNTER — Other Ambulatory Visit: Payer: Self-pay

## 2021-03-25 VITALS — BP 111/73 | HR 73 | Resp 16 | Ht 60.0 in | Wt 106.2 lb

## 2021-03-25 DIAGNOSIS — Z3200 Encounter for pregnancy test, result unknown: Secondary | ICD-10-CM | POA: Diagnosis not present

## 2021-03-25 LAB — POCT URINE PREGNANCY: Preg Test, Ur: POSITIVE — AB

## 2021-03-25 NOTE — Patient Instructions (Signed)
https://www.acog.org/womens-health/faqs/prenatal-genetic-screening-tests">  Prenatal Care Prenatal care is health care during pregnancy. It helps you and your unborn baby (fetus) stay as healthy as possible. Prenatal care may be provided by a midwife, a family practice doctor, a mid-level practitioner (nurse practitioner or physician assistant), or a childbirth and pregnancy doctor (obstetrician). How does this affect me? During pregnancy, you will be closely monitored for any new conditions that might develop. To lower your risk of pregnancy complications, you and your health care provider will talk about any underlying conditions you have. How does this affect my baby? Early and consistent prenatal care increases the chance that your baby will be healthy during pregnancy. Prenatal care lowers the risk that your baby will be:  Born early (prematurely).  Smaller than expected at birth (small for gestational age). What can I expect at the first prenatal care visit? Your first prenatal care visit will likely be the longest. You should schedule your first prenatal care visit as soon as you know that you are pregnant. Your first visit is a good time to talk about any questions or concerns you have about pregnancy. Medical history At your visit, you and your health care provider will talk about your medical history, including:  Any past pregnancies.  Your family's medical history.  Medical history of the baby's father.  Any long-term (chronic) health conditions you have and how you manage them.  Any surgeries or procedures you have had.  Any current over-the-counter or prescription medicines, herbs, or supplements that you are taking.  Other factors that could pose a risk to your baby, including: ? Exposure to harmful chemicals or radiation at work or at home. ? Any substance use, including tobacco, alcohol, and drug use.  Your home setting and your stress levels, including: ? Exposure to  abuse or violence. ? Household financial strain.  Your daily health habits, including diet and exercise. Tests and screenings Your health care provider will:  Measure your weight, height, and blood pressure.  Do a physical exam, including a pelvic and breast exam.  Perform blood tests and urine tests to check for: ? Urinary tract infection. ? Sexually transmitted infections (STIs). ? Low iron levels in your blood (anemia). ? Blood type and certain proteins on red blood cells (Rh antibodies). ? Infections and immunity to viruses, such as hepatitis B and rubella. ? HIV (human immunodeficiency virus).  Discuss your options for genetic screening. Tips about staying healthy Your health care provider will also give you information about how to keep yourself and your baby healthy, including:  Nutrition and taking vitamins.  Physical activity.  How to manage pregnancy symptoms such as nausea and vomiting (morning sickness).  Infections and substances that may be harmful to your baby and how to avoid them.  Food safety.  Dental care.  Working.  Travel.  Warning signs to watch for and when to call your health care provider. How often will I have prenatal care visits? After your first prenatal care visit, you will have regular visits throughout your pregnancy. The visit schedule is often as follows:  Up to week 28 of pregnancy: once every 4 weeks.  28-36 weeks: once every 2 weeks.  After 36 weeks: every week until delivery. Some women may have visits more or less often depending on any underlying health conditions and the health of the baby. Keep all follow-up and prenatal care visits. This is important. What happens during routine prenatal care visits? Your health care provider will:  Measure your weight   and blood pressure.  Check for fetal heart sounds.  Measure the height of your uterus in your abdomen (fundal height). This may be measured starting around week 20 of  pregnancy.  Check the position of your baby inside your uterus.  Ask questions about your diet, sleeping patterns, and whether you can feel the baby move.  Review warning signs to watch for and signs of labor.  Ask about any pregnancy symptoms you are having and how you are dealing with them. Symptoms may include: ? Headaches. ? Nausea and vomiting. ? Vaginal discharge. ? Swelling. ? Fatigue. ? Constipation. ? Changes in your vision. ? Feeling persistently sad or anxious. ? Any discomfort, including back or pelvic pain. ? Bleeding or spotting. Make a list of questions to ask your health care provider at your routine visits.   What tests might I have during prenatal care visits? You may have blood, urine, and imaging tests throughout your pregnancy, such as:  Urine tests to check for glucose, protein, or signs of infection.  Glucose tests to check for a form of diabetes that can develop during pregnancy (gestational diabetes mellitus). This is usually done around week 24 of pregnancy.  Ultrasounds to check your baby's growth and development, to check for birth defects, and to check your baby's well-being. These can also help to decide when you should deliver your baby.  A test to check for group B strep (GBS) infection. This is usually done around week 36 of pregnancy.  Genetic testing. This may include blood, fluid, or tissue sampling, or imaging tests, such as an ultrasound. Some genetic tests are done during the first trimester and some are done during the second trimester. What else can I expect during prenatal care visits? Your health care provider may recommend getting certain vaccines during pregnancy. These may include:  A yearly flu shot (annual influenza vaccine). This is especially important if you will be pregnant during flu season.  Tdap (tetanus, diphtheria, pertussis) vaccine. Getting this vaccine during pregnancy can protect your baby from whooping cough  (pertussis) after birth. This vaccine may be recommended between weeks 27 and 36 of pregnancy.  A COVID-19 vaccine. Later in your pregnancy, your health care provider may give you information about:  Childbirth and breastfeeding classes.  Choosing a health care provider for your baby.  Umbilical cord banking.  Breastfeeding.  Birth control after your baby is born.  The hospital labor and delivery unit and how to set up a tour.  Registering at the hospital before you go into labor. Where to find more information  Office on Women's Health: womenshealth.gov  American Pregnancy Association: americanpregnancy.org  March of Dimes: marchofdimes.org Summary  Prenatal care helps you and your baby stay as healthy as possible during pregnancy.  Your first prenatal care visit will most likely be the longest.  You will have visits and tests throughout your pregnancy to monitor your health and your baby's health.  Bring a list of questions to your visits to ask your health care provider.  Make sure to keep all follow-up and prenatal care visits. This information is not intended to replace advice given to you by your health care provider. Make sure you discuss any questions you have with your health care provider. Document Revised: 09/19/2020 Document Reviewed: 09/19/2020 Elsevier Patient Education  2021 Elsevier Inc.  

## 2021-03-25 NOTE — Progress Notes (Signed)
Subjective:    Oleda Borski is a 30 y.o. female who presents for evaluation of amenorrhea. She believes she could be pregnant. Pregnancy is desired. Sexual Activity: single partner, contraception: none. Current symptoms also include: breast tenderness. Last period was normal.    The following portions of the patient's history were reviewed and updated as appropriate: allergies, current medications, past family history, past medical history, past social history, past surgical history and problem list.  Review of Systems Pertinent items are noted in HPI.     Objective:    There were no vitals taken for this visit. General: alert, cooperative, appears stated age and no acute distress    Lab Review Urine HCG: positive    Assessment:    Absence of menstruation.     Plan:   Positive: EDC: 11/11/21. Briefly discussed pre-natal care options.Prenatal care with midwives or MDs reviewed. Plans to see the midwives.. Encouraged well-balanced diet, plenty of rest when needed, pre-natal vitamins daily and walking for exercise. Discussed self-help for nausea, avoiding OTC medications until consulting provider or pharmacist, other than Tylenol as needed, minimal caffeine (1-2 cups daily) and avoiding alcohol. She will schedule an u/s for dating , a nurse visit @ [redacted] wks pregnant , her initial NOB visit @ [redacted] wks pregnant. Feel free to call with any questions.   Doreene Burke, CNM

## 2021-03-26 ENCOUNTER — Telehealth: Payer: Self-pay

## 2021-03-26 NOTE — Telephone Encounter (Signed)
[facility]  SK states pt was not happy about having her u/s so far out. Per LMP pt is 7/1 today.   U/s is scheduled on 5/13 at [facility].

## 2021-03-26 NOTE — Telephone Encounter (Signed)
Informed pt that she was given 1st available appt. Given number to centralize scheduling to see if she can get seen sooner.

## 2021-04-18 ENCOUNTER — Other Ambulatory Visit: Payer: Self-pay

## 2021-04-18 ENCOUNTER — Ambulatory Visit (INDEPENDENT_AMBULATORY_CARE_PROVIDER_SITE_OTHER): Payer: Managed Care, Other (non HMO) | Admitting: Surgical

## 2021-04-18 VITALS — BP 93/52 | HR 98 | Ht 60.0 in | Wt 104.0 lb

## 2021-04-18 DIAGNOSIS — Z3491 Encounter for supervision of normal pregnancy, unspecified, first trimester: Secondary | ICD-10-CM

## 2021-04-18 NOTE — Patient Instructions (Signed)
https://www.cdc.gov/pregnancy/infections.html">  First Trimester of Pregnancy  The first trimester of pregnancy starts on the first day of your last menstrual period until the end of week 12. This is also called months 1 through 3 of pregnancy. Body changes during your first trimester Your body goes through many changes during pregnancy. The changes usually return to normal after your baby is born. Physical changes  You may gain or lose weight.  Your breasts may grow larger and hurt. The area around your nipples may get darker.  Dark spots or blotches may develop on your face.  You may have changes in your hair. Health changes  You may feel like you might vomit (nauseous), and you may vomit.  You may have heartburn.  You may have headaches.  You may have trouble pooping (constipation).  Your gums may bleed. Other changes  You may get tired easily.  You may pee (urinate) more often.  Your menstrual periods will stop.  You may not feel hungry.  You may want to eat certain kinds of food.  You may have changes in your emotions from day to day.  You may have more dreams. Follow these instructions at home: Medicines  Take over-the-counter and prescription medicines only as told by your doctor. Some medicines are not safe during pregnancy.  Take a prenatal vitamin that contains at least 600 micrograms (mcg) of folic acid. Eating and drinking  Eat healthy meals that include: ? Fresh fruits and vegetables. ? Whole grains. ? Good sources of protein, such as meat, eggs, or tofu. ? Low-fat dairy products.  Avoid raw meat and unpasteurized juice, milk, and cheese.  If you feel like you may vomit, or you vomit: ? Eat 4 or 5 small meals a day instead of 3 large meals. ? Try eating a few soda crackers. ? Drink liquids between meals instead of during meals.  You may need to take these actions to prevent or treat trouble pooping: ? Drink enough fluids to keep your pee  (urine) pale yellow. ? Eat foods that are high in fiber. These include beans, whole grains, and fresh fruits and vegetables. ? Limit foods that are high in fat and sugar. These include fried or sweet foods. Activity  Exercise only as told by your doctor. Most people can do their usual exercise routine during pregnancy.  Stop exercising if you have cramps or pain in your lower belly (abdomen) or low back.  Do not exercise if it is too hot or too humid, or if you are in a place of great height (high altitude).  Avoid heavy lifting.  If you choose to, you may have sex unless your doctor tells you not to. Relieving pain and discomfort  Wear a good support bra if your breasts are sore.  Rest with your legs raised (elevated) if you have leg cramps or low back pain.  If you have bulging veins (varicose veins) in your legs: ? Wear support hose as told by your doctor. ? Raise your feet for 15 minutes, 3-4 times a day. ? Limit salt in your food. Safety  Wear your seat belt at all times when you are in a car.  Talk with your doctor if someone is hurting you or yelling at you.  Talk with your doctor if you are feeling sad or have thoughts of hurting yourself. Lifestyle  Do not use hot tubs, steam rooms, or saunas.  Do not douche. Do not use tampons or scented sanitary pads.  Do not   use herbal medicines, illegal drugs, or medicines that are not approved by your doctor. Do not drink alcohol.  Do not smoke or use any products that contain nicotine or tobacco. If you need help quitting, ask your doctor.  Avoid cat litter boxes and soil that is used by cats. These carry germs that can cause harm to the baby and can cause a loss of your baby by miscarriage or stillbirth. General instructions  Keep all follow-up visits. This is important.  Ask for help if you need counseling or if you need help with nutrition. Your doctor can give you advice or tell you where to go for help.  Visit your  dentist. At home, brush your teeth with a soft toothbrush. Floss gently.  Write down your questions. Take them to your prenatal visits. Where to find more information  American Pregnancy Association: americanpregnancy.org  American College of Obstetricians and Gynecologists: www.acog.org  Office on Women's Health: womenshealth.gov/pregnancy Contact a doctor if:  You are dizzy.  You have a fever.  You have mild cramps or pressure in your lower belly.  You have a nagging pain in your belly area.  You continue to feel like you may vomit, you vomit, or you have watery poop (diarrhea) for 24 hours or longer.  You have a bad-smelling fluid coming from your vagina.  You have pain when you pee.  You are exposed to a disease that spreads from person to person, such as chickenpox, measles, Zika virus, HIV, or hepatitis. Get help right away if:  You have spotting or bleeding from your vagina.  You have very bad belly cramping or pain.  You have shortness of breath or chest pain.  You have any kind of injury, such as from a fall or a car crash.  You have new or increased pain, swelling, or redness in an arm or leg. Summary  The first trimester of pregnancy starts on the first day of your last menstrual period until the end of week 12 (months 1 through 3).  Eat 4 or 5 small meals a day instead of 3 large meals.  Do not smoke or use any products that contain nicotine or tobacco. If you need help quitting, ask your doctor.  Keep all follow-up visits. This information is not intended to replace advice given to you by your health care provider. Make sure you discuss any questions you have with your health care provider. Document Revised: 05/15/2020 Document Reviewed: 03/21/2020 Elsevier Patient Education  2021 Elsevier Inc.    Common Medications Safe in Pregnancy  Acne:      Constipation:  Benzoyl Peroxide     Colace  Clindamycin      Dulcolax Suppository  Topica  Erythromycin     Fibercon  Salicylic Acid      Metamucil         Miralax AVOID:        Senakot   Accutane    Cough:  Retin-A       Cough Drops  Tetracycline      Phenergan w/ Codeine if Rx  Minocycline      Robitussin (Plain & DM)  Antibiotics:     Crabs/Lice:  Ceclor       RID  Cephalosporins    AVOID:  E-Mycins      Kwell  Keflex  Macrobid/Macrodantin   Diarrhea:  Penicillin      Kao-Pectate  Zithromax      Imodium AD           PUSH FLUIDS AVOID:       Cipro     Fever:  Tetracycline      Tylenol (Regular or Extra  Minocycline       Strength)  Levaquin      Extra Strength-Do not          Exceed 8 tabs/24 hrs Caffeine:        <200mg/day (equiv. To 1 cup of coffee or  approx. 3 12 oz sodas)         Gas: Cold/Hayfever:       Gas-X  Benadryl      Mylicon  Claritin       Phazyme  **Claritin-D        Chlor-Trimeton    Headaches:  Dimetapp      ASA-Free Excedrin  Drixoral-Non-Drowsy     Cold Compress  Mucinex (Guaifenasin)     Tylenol (Regular or Extra  Sudafed/Sudafed-12 Hour     Strength)  **Sudafed PE Pseudoephedrine   Tylenol Cold & Sinus     Vicks Vapor Rub  Zyrtec  **AVOID if Problems With Blood Pressure         Heartburn: Avoid lying down for at least 1 hour after meals  Aciphex      Maalox     Rash:  Milk of Magnesia     Benadryl    Mylanta       1% Hydrocortisone Cream  Pepcid  Pepcid Complete   Sleep Aids:  Prevacid      Ambien   Prilosec       Benadryl  Rolaids       Chamomile Tea  Tums (Limit 4/day)     Unisom         Tylenol PM         Warm milk-add vanilla or  Hemorrhoids:       Sugar for taste  Anusol/Anusol H.C.  (RX: Analapram 2.5%)  Sugar Substitutes:  Hydrocortisone OTC     Ok in moderation  Preparation H      Tucks        Vaseline lotion applied to tissue with wiping    Herpes:     Throat:  Acyclovir      Oragel  Famvir  Valtrex     Vaccines:         Flu Shot Leg Cramps:       *Gardasil  Benadryl      Hepatitis  A         Hepatitis B Nasal Spray:       Pneumovax  Saline Nasal Spray     Polio Booster         Tetanus Nausea:       Tuberculosis test or PPD  Vitamin B6 25 mg TID   AVOID:    Dramamine      *Gardasil  Emetrol       Live Poliovirus  Ginger Root 250 mg QID    MMR (measles, mumps &  High Complex Carbs @ Bedtime    rebella)  Sea Bands-Accupressure    Varicella (Chickenpox)  Unisom 1/2 tab TID     *No known complications           If received before Pain:         Known pregnancy;   Darvocet       Resume series after  Lortab        Delivery  Percocet    Yeast:   Tramadol        Femstat  Tylenol 3      Gyne-lotrimin  Ultram       Monistat  Vicodin           MISC:         All Sunscreens           Hair Coloring/highlights          Insect Repellant's          (Including DEET)         Mystic Tans  

## 2021-04-18 NOTE — Progress Notes (Signed)
Megan Mack presents for NOB nurse interview visit. Pregnancy confirmation done _4/04/2021. G1. P0. Pregnancy education material explained and given. 0 cats in home. NOB labs ordered.  HIV labs and drug screen were explained and ordered . PNV encouraged. Genetic screening options discussed. Genetic testing: Ordered/Declined/Unsure. Patient may discuss with the provider. Patient to follow up with provider on 05/05/2021 for NOB physical. All questions answered. Financial policy explained. FMLA and drug form signed.

## 2021-04-19 LAB — VARICELLA ZOSTER ANTIBODY, IGG: Varicella zoster IgG: 784 index (ref >165)

## 2021-04-19 LAB — URINALYSIS, ROUTINE W REFLEX MICROSCOPIC
Bilirubin, UA: NEGATIVE
Glucose, UA: NEGATIVE
Ketones, UA: NEGATIVE
Leukocytes,UA: NEGATIVE
Nitrite, UA: NEGATIVE
RBC, UA: NEGATIVE
Specific Gravity, UA: 1.019 (ref 1.005–1.030)
Urobilinogen, Ur: 0.2 mg/dL (ref 0.2–1.0)
pH, UA: 6.5 (ref 5.0–7.5)

## 2021-04-19 LAB — RPR: RPR Ser Ql: NONREACTIVE

## 2021-04-19 LAB — TOXOPLASMA ANTIBODIES- IGG AND  IGM
Toxoplasma Antibody- IgM: <3 AU/mL (ref 0.0–7.9)
Toxoplasma IgG Ratio: <3 IU/mL (ref 0.0–7.1)

## 2021-04-19 LAB — HIV ANTIBODY (ROUTINE TESTING W REFLEX): HIV Screen 4th Generation wRfx: NONREACTIVE

## 2021-04-19 LAB — RUBELLA SCREEN: Rubella Antibodies, IGG: <0.9 index — ABNORMAL LOW (ref >0.99)

## 2021-04-19 LAB — ANTIBODY SCREEN: Antibody Screen: NEGATIVE

## 2021-04-19 LAB — ABO AND RH: Rh Factor: POSITIVE

## 2021-04-20 LAB — CULTURE, OB URINE

## 2021-04-20 LAB — GC/CHLAMYDIA PROBE AMP
Chlamydia trachomatis, NAA: NEGATIVE
Neisseria Gonorrhoeae by PCR: NEGATIVE

## 2021-04-20 LAB — URINE CULTURE, OB REFLEX

## 2021-04-21 LAB — MONITOR DRUG PROFILE 14(MW)
Amphetamine Scrn, Ur: NEGATIVE ng/mL
BARBITURATE SCREEN URINE: NEGATIVE ng/mL
BENZODIAZEPINE SCREEN, URINE: NEGATIVE ng/mL
Buprenorphine, Urine: NEGATIVE ng/mL
CANNABINOIDS UR QL SCN: NEGATIVE ng/mL
Cocaine (Metab) Scrn, Ur: NEGATIVE ng/mL
Creatinine(Crt), U: 176 mg/dL (ref 20.0–300.0)
Fentanyl, Urine: NEGATIVE pg/mL
Meperidine Screen, Urine: NEGATIVE ng/mL
Methadone Screen, Urine: NEGATIVE ng/mL
OXYCODONE+OXYMORPHONE UR QL SCN: NEGATIVE ng/mL
Opiate Scrn, Ur: NEGATIVE ng/mL
Ph of Urine: 6.6 (ref 4.5–8.9)
Phencyclidine Qn, Ur: NEGATIVE ng/mL
Propoxyphene Scrn, Ur: NEGATIVE ng/mL
SPECIFIC GRAVITY: 1.025
Tramadol Screen, Urine: NEGATIVE ng/mL

## 2021-04-30 ENCOUNTER — Encounter: Payer: Managed Care, Other (non HMO) | Admitting: Certified Nurse Midwife

## 2021-05-02 ENCOUNTER — Ambulatory Visit
Admission: RE | Admit: 2021-05-02 | Discharge: 2021-05-02 | Disposition: A | Discharge status: Home / Self Care | Payer: Managed Care, Other (non HMO) | Source: Ambulatory Visit | Attending: Certified Nurse Midwife | Admitting: Certified Nurse Midwife

## 2021-05-02 ENCOUNTER — Other Ambulatory Visit: Payer: Self-pay

## 2021-05-02 ENCOUNTER — Other Ambulatory Visit: Payer: Self-pay | Admitting: Certified Nurse Midwife

## 2021-05-02 DIAGNOSIS — Z3200 Encounter for pregnancy test, result unknown: Secondary | ICD-10-CM | POA: Insufficient documentation

## 2021-05-05 ENCOUNTER — Ambulatory Visit (INDEPENDENT_AMBULATORY_CARE_PROVIDER_SITE_OTHER): Payer: Managed Care, Other (non HMO) | Admitting: Certified Nurse Midwife

## 2021-05-05 ENCOUNTER — Encounter: Payer: Self-pay | Admitting: Certified Nurse Midwife

## 2021-05-05 ENCOUNTER — Other Ambulatory Visit: Payer: Self-pay

## 2021-05-05 ENCOUNTER — Other Ambulatory Visit (HOSPITAL_COMMUNITY)
Admission: RE | Admit: 2021-05-05 | Discharge: 2021-05-05 | Disposition: A | Discharge status: Home / Self Care | Payer: Managed Care, Other (non HMO) | Source: Ambulatory Visit | Attending: Certified Nurse Midwife | Admitting: Certified Nurse Midwife

## 2021-05-05 DIAGNOSIS — K588 Other irritable bowel syndrome: Secondary | ICD-10-CM | POA: Diagnosis not present

## 2021-05-05 DIAGNOSIS — Z3A12 12 weeks gestation of pregnancy: Secondary | ICD-10-CM

## 2021-05-05 DIAGNOSIS — Z3491 Encounter for supervision of normal pregnancy, unspecified, first trimester: Secondary | ICD-10-CM | POA: Insufficient documentation

## 2021-05-05 DIAGNOSIS — K589 Irritable bowel syndrome without diarrhea: Secondary | ICD-10-CM | POA: Insufficient documentation

## 2021-05-05 DIAGNOSIS — L409 Psoriasis, unspecified: Secondary | ICD-10-CM

## 2021-05-05 LAB — POCT URINALYSIS DIPSTICK OB
Bilirubin, UA: NEGATIVE
Blood, UA: NEGATIVE
Glucose, UA: NEGATIVE
Ketones, UA: NEGATIVE
Leukocytes, UA: NEGATIVE
Nitrite, UA: NEGATIVE
POC,PROTEIN,UA: NEGATIVE
Spec Grav, UA: >=1.03 — AB (ref 1.010–1.025)
Urobilinogen, UA: 0.2 E.U./dL
pH, UA: 6 (ref 5.0–8.0)

## 2021-05-05 NOTE — Progress Notes (Signed)
NEW OB HISTORY AND PHYSICAL  SUBJECTIVE:       Megan Mack is a 30 y.o. [id number] female, Patient's last menstrual period was 02/04/2021., Estimated Date of Delivery: 11/11/21, [redacted]w[redacted]d, presents today for establishment of Prenatal Care. She has no unusual complaints   Social Married Lives with spouse Works: in school dor interior architecture  Exercise: walking some  Denies smoking/vaping, alcohol , and drug use   Gynecologic History Patient's last menstrual period was 02/04/2021. Normal Contraception: none Last Pap: 09/21/2018. Results were: normal  Obstetric History OB History  Gravida Para Term Preterm AB Living  1 0 0 0 0 0  SAB IAB Ectopic Multiple Live Births  0 0 0 0 0    # Outcome Date GA Lbr Len/2nd Weight Sex Delivery Anes PTL Lv  1 Current             Past Medical History:  Diagnosis Date  . Colitis   . Inappropriate sinus tachycardia   . PCOS (polycystic ovarian syndrome)     Past Surgical History:  Procedure Laterality Date  . NASAL SINUS SURGERY    . TONSILLECTOMY    . WISDOM TOOTH EXTRACTION      Current Outpatient Medications on File Prior to Visit  Medication Sig Dispense Refill  . inFLIXimab (REMICADE IV) Inject into the vein.    . metoprolol tartrate (LOPRESSOR) 25 MG tablet Take by mouth.    . predniSONE (DELTASONE) 10 MG tablet Take by mouth. (Patient not taking: Reported on 04/18/2021)    . Prenatal Vit-Fe Fumarate-FA (MULTIVITAMIN-PRENATAL) 27-0.8 MG TABS tablet Take 1 tablet by mouth daily at 12 noon.     No current facility-administered medications on file prior to visit.    Allergies  Allergen Reactions  . Ondansetron Hcl Nausea And Vomiting    N/V, GI upset    Social History   Socioeconomic History  . Marital status: Married    Spouse name: Not on file  . Number of children: Not on file  . Years of education: Not on file  . Highest education level: Not on file  Occupational History  . Not on file  Tobacco Use  . Smoking status:  Never Smoker  . Smokeless tobacco: Never Used  Substance and Sexual Activity  . Alcohol use: Not Currently    Comment: occas  . Drug use: Never  . Sexual activity: Yes    Birth control/protection: None    Comment: mirena  Other Topics Concern  . Not on file  Social History Narrative  . Not on file   Social Determinants of Health   Financial Resource Strain: Not on file  Food Insecurity: Not on file  Transportation Needs: Not on file  Physical Activity: Not on file  Stress: Not on file  Social Connections: Not on file  Intimate Partner Violence: Not on file    Family History  Problem Relation Age of Onset  . Cancer Father   . Stroke Maternal Grandmother     The following portions of the patient's history were reviewed and updated as appropriate: allergies, current medications, past OB history, past medical history, past surgical history, past family history, past social history, and problem list.    OBJECTIVE: Initial Physical Exam (New OB)  GENERAL APPEARANCE: alert, well appearing, in no apparent distress, oriented to person, place and time HEAD: normocephalic, atraumatic MOUTH: mucous membranes moist, pharynx normal without lesions THYROID: no thyromegaly or masses present BREASTS: no masses noted, no significant tenderness, no palpable axillary nodes,  no skin changes LUNGS: clear to auscultation, no wheezes, rales or rhonchi, symmetric air entry HEART: regular rate and rhythm, no murmurs ABDOMEN: soft, nontender, nondistended, no abnormal masses, no epigastric pain and FHT present EXTREMITIES: no redness or tenderness in the calves or thighs SKIN: normal coloration and turgor, no rashes LYMPH NODES: no adenopathy palpable NEUROLOGIC: alert, oriented, normal speech, no focal findings or movement disorder noted  PELVIC EXAM EXTERNAL GENITALIA: normal appearing vulva with no masses, tenderness or lesions VAGINA: no abnormal discharge or lesions CERVIX: no lesions  or cervical motion tenderness, pap smear completed, contact bleeding  UTERUS: gravid ADNEXA: no masses palpable and nontender OB EXAM PELVIMETRY: appears adequate RECTUM: exam not indicated  ASSESSMENT: Normal pregnancy  PLAN: New OB counseling: The patient has been given an overview regarding routine prenatal care. Recommendations regarding diet, weight gain, and exercise in pregnancy were given. Prenatal testing, optional genetic testing,carrier screening, and ultrasound use in pregnancy were reviewed. Declines genetic testing. Benefits of Breast Feeding were discussed. The patient is encouraged to consider nursing her baby post partum.  Doreene Burke, CNM

## 2021-05-05 NOTE — Patient Instructions (Signed)
WHAT OB PATIENTS CAN EXPECT   Confirmation of pregnancy and ultrasound ordered if medically indicated-[redacted] weeks gestation  New OB (NOB) intake with nurse and New OB (NOB) labs- [redacted] weeks gestation  New OB (NOB) physical examination with provider- 11/[redacted] weeks gestation  Flu vaccine-[redacted] weeks gestation  Anatomy scan-[redacted] weeks gestation  Glucose tolerance test, blood work to test for anemia, T-dap vaccine-[redacted] weeks gestation  Vaginal swabs/cultures-STD/Group B strep-[redacted] weeks gestation  Appointments every 4 weeks until 28 weeks  Every 2 weeks from 28 weeks until 36 weeks  Weekly visits from 36 weeks until delivery  Second Trimester of Pregnancy  The second trimester of pregnancy is from week 13 through week 27. This is also called months 4 through 6 of pregnancy. This is often the time when you feel your best. During the second trimester:  Morning sickness is less or has stopped.  You may have more energy.  You may feel hungry more often. At this time, your unborn baby (fetus) is growing very fast. At the end of the sixth month, the unborn baby may be up to 12 inches long and weigh about 1 pounds. You will likely start to feel the baby move between 16 and 20 weeks of pregnancy. Body changes during your second trimester Your body continues to go through many changes during this time. The changes vary and generally return to normal after the baby is born. Physical changes  You will gain more weight.  You may start to get stretch marks on your hips, belly (abdomen), and breasts.  Your breasts will grow and may hurt.  Dark spots or blotches may develop on your face.  A dark line from your belly button to the pubic area (linea nigra) may appear.  You may have changes in your hair. Health changes  You may have headaches.  You may have heartburn.  You may have trouble pooping (constipation).  You may have hemorrhoids or swollen, bulging veins (varicose veins).  Your gums may  bleed.  You may pee (urinate) more often.  You may have back pain. Follow these instructions at home: Medicines  Take over-the-counter and prescription medicines only as told by your doctor. Some medicines are not safe during pregnancy.  Take a prenatal vitamin that contains at least 600 micrograms (mcg) of folic acid. Eating and drinking  Eat healthy meals that include: ? Fresh fruits and vegetables. ? Whole grains. ? Good sources of protein, such as meat, eggs, or tofu. ? Low-fat dairy products.  Avoid raw meat and unpasteurized juice, milk, and cheese.  You may need to take these actions to prevent or treat trouble pooping: ? Drink enough fluids to keep your pee (urine) pale yellow. ? Eat foods that are high in fiber. These include beans, whole grains, and fresh fruits and vegetables. ? Limit foods that are high in fat and sugar. These include fried or sweet foods. Activity  Exercise only as told by your doctor. Most people can do their usual exercise during pregnancy. Try to exercise for 30 minutes at least 5 days a week.  Stop exercising if you have pain or cramps in your belly or lower back.  Do not exercise if it is too hot or too humid, or if you are in a place of great height (high altitude).  Avoid heavy lifting.  If you choose to, you may have sex unless your doctor tells you not to. Relieving pain and discomfort  Wear a good support bra if your breasts   are sore.  Take warm water baths (sitz baths) to soothe pain or discomfort caused by hemorrhoids. Use hemorrhoid cream if your doctor approves.  Rest with your legs raised (elevated) if you have leg cramps or low back pain.  If you develop bulging veins in your legs: ? Wear support hose as told by your doctor. ? Raise your feet for 15 minutes, 3-4 times a day. ? Limit salt in your food. Safety  Wear your seat belt at all times when you are in a car.  Talk with your doctor if someone is hurting you or  yelling at you a lot. Lifestyle  Do not use hot tubs, steam rooms, or saunas.  Do not douche. Do not use tampons or scented sanitary pads.  Avoid cat litter boxes and soil used by cats. These carry germs that can harm your baby and can cause a loss of your baby by miscarriage or stillbirth.  Do not use herbal medicines, illegal drugs, or medicines that are not approved by your doctor. Do not drink alcohol.  Do not smoke or use any products that contain nicotine or tobacco. If you need help quitting, ask your doctor. General instructions  Keep all follow-up visits. This is important.  Ask your doctor about local prenatal classes.  Ask your doctor about the right foods to eat or for help finding a counselor. Where to find more information  American Pregnancy Association: americanpregnancy.org  American College of Obstetricians and Gynecologists: www.acog.org  Office on Women's Health: womenshealth.gov/pregnancy Contact a doctor if:  You have a headache that does not go away when you take medicine.  You have changes in how you see, or you see spots in front of your eyes.  You have mild cramps, pressure, or pain in your lower belly.  You continue to feel like you may vomit (nauseous), you vomit, or you have watery poop (diarrhea).  You have bad-smelling fluid coming from your vagina.  You have pain when you pee or your pee smells bad.  You have very bad swelling of your face, hands, ankles, feet, or legs.  You have a fever. Get help right away if:  You are leaking fluid from your vagina.  You have spotting or bleeding from your vagina.  You have very bad belly cramping or pain.  You have trouble breathing.  You have chest pain.  You faint.  You have not felt your baby move for the time period told by your doctor.  You have new or increased pain, swelling, or redness in an arm or leg. Summary  The second trimester of pregnancy is from week 13 through week 27  (months 4 through 6).  Eat healthy meals.  Exercise as told by your doctor. Most people can do their usual exercise during pregnancy.  Do not use herbal medicines, illegal drugs, or medicines that are not approved by your doctor. Do not drink alcohol.  Call your doctor if you get sick or if you notice anything unusual about your pregnancy. This information is not intended to replace advice given to you by your health care provider. Make sure you discuss any questions you have with your health care provider. Document Revised: 05/15/2020 Document Reviewed: 03/21/2020 Elsevier Patient Education  2021 Elsevier Inc. Commonly Asked Questions During Pregnancy  Cats: A parasite can be excreted in cat feces.  To avoid exposure you need to have another person empty the little box.  If you must empty the litter box you will need to   wear gloves.  Wash your hands after handling your cat.  This parasite can also be found in raw or undercooked meat so this should also be avoided.  Colds, Sore Throats, Flu: Please check your medication sheet to see what you can take for symptoms.  If your symptoms are unrelieved by these medications please call the office.  Dental Work: Most any dental work your dentist recommends is permitted.  X-rays should only be taken during the first trimester if absolutely necessary.  Your abdomen should be shielded with a lead apron during all x-rays.  Please notify your provider prior to receiving any x-rays.  Novocaine is fine; gas is not recommended.  If your dentist requires a note from us prior to dental work please call the office and we will provide one for you.  Exercise: Exercise is an important part of staying healthy during your pregnancy.  You may continue most exercises you were accustomed to prior to pregnancy.  Later in your pregnancy you will most likely notice you have difficulty with activities requiring balance like riding a bicycle.  It is important that you listen  to your body and avoid activities that put you at a higher risk of falling.  Adequate rest and staying well hydrated are a must!  If you have questions about the safety of specific activities ask your provider.    Exposure to Children with illness: Try to avoid obvious exposure; report any symptoms to us when noted,  If you have chicken pos, red measles or mumps, you should be immune to these diseases.   Please do not take any vaccines while pregnant unless you have checked with your OB provider.  Fetal Movement: After 28 weeks we recommend you do "kick counts" twice daily.  Lie or sit down in a calm quiet environment and count your baby movements "kicks".  You should feel your baby at least 10 times per hour.  If you have not felt 10 kicks within the first hour get up, walk around and have something sweet to eat or drink then repeat for an additional hour.  If count remains less than 10 per hour notify your provider.  Fumigating: Follow your pest control agent's advice as to how long to stay out of your home.  Ventilate the area well before re-entering.  Hemorrhoids:   Most over-the-counter preparations can be used during pregnancy.  Check your medication to see what is safe to use.  It is important to use a stool softener or fiber in your diet and to drink lots of liquids.  If hemorrhoids seem to be getting worse please call the office.   Hot Tubs:  Hot tubs Jacuzzis and saunas are not recommended while pregnant.  These increase your internal body temperature and should be avoided.  Intercourse:  Sexual intercourse is safe during pregnancy as long as you are comfortable, unless otherwise advised by your provider.  Spotting may occur after intercourse; report any bright red bleeding that is heavier than spotting.  Labor:  If you know that you are in labor, please go to the hospital.  If you are unsure, please call the office and let us help you decide what to do.  Lifting, straining, etc:  If your  job requires heavy lifting or straining please check with your provider for any limitations.  Generally, you should not lift items heavier than that you can lift simply with your hands and arms (no back muscles)  Painting:  Paint fumes do not   harm your pregnancy, but may make you ill and should be avoided if possible.  Latex or water based paints have less odor than oils.  Use adequate ventilation while painting.  Permanents & Hair Color:  Chemicals in hair dyes are not recommended as they cause increase hair dryness which can increase hair loss during pregnancy.  " Highlighting" and permanents are allowed.  Dye may be absorbed differently and permanents may not hold as well during pregnancy.  Sunbathing:  Use a sunscreen, as skin burns easily during pregnancy.  Drink plenty of fluids; avoid over heating.  Tanning Beds:  Because their possible side effects are still unknown, tanning beds are not recommended.  Ultrasound Scans:  Routine ultrasounds are performed at approximately 20 weeks.  You will be able to see your baby's general anatomy an if you would like to know the gender this can usually be determined as well.  If it is questionable when you conceived you may also receive an ultrasound early in your pregnancy for dating purposes.  Otherwise ultrasound exams are not routinely performed unless there is a medical necessity.  Although you can request a scan we ask that you pay for it when conducted because insurance does not cover " patient request" scans.  Work: If your pregnancy proceeds without complications you may work until your due date, unless your physician or employer advises otherwise.  Round Ligament Pain/Pelvic Discomfort:  Sharp, shooting pains not associated with bleeding are fairly common, usually occurring in the second trimester of pregnancy.  They tend to be worse when standing up or when you remain standing for long periods of time.  These are the result of pressure of certain  pelvic ligaments called "round ligaments".  Rest, Tylenol and heat seem to be the most effective relief.  As the womb and fetus grow, they rise out of the pelvis and the discomfort improves.  Please notify the office if your pain seems different than that described.  It may represent a more serious condition.  Common Medications Safe in Pregnancy  Acne:      Constipation:  Benzoyl Peroxide     Colace  Clindamycin      Dulcolax Suppository  Topica Erythromycin     Fibercon  Salicylic Acid      Metamucil         Miralax AVOID:        Senakot   Accutane    Cough:  Retin-A       Cough Drops  Tetracycline      Phenergan w/ Codeine if Rx  Minocycline      Robitussin (Plain & DM)  Antibiotics:     Crabs/Lice:  Ceclor       RID  Cephalosporins    AVOID:  E-Mycins      Kwell  Keflex  Macrobid/Macrodantin   Diarrhea:  Penicillin      Kao-Pectate  Zithromax      Imodium AD         PUSH FLUIDS AVOID:       Cipro     Fever:  Tetracycline      Tylenol (Regular or Extra  Minocycline       Strength)  Levaquin      Extra Strength-Do not          Exceed 8 tabs/24 hrs Caffeine:        <200mg/day (equiv. To 1 cup of coffee or  approx. 3 12 oz sodas)           Gas: Cold/Hayfever:       Gas-X  Benadryl      Mylicon  Claritin       Phazyme  **Claritin-D        Chlor-Trimeton    Headaches:  Dimetapp      ASA-Free Excedrin  Drixoral-Non-Drowsy     Cold Compress  Mucinex (Guaifenasin)     Tylenol (Regular or Extra  Sudafed/Sudafed-12 Hour     Strength)  **Sudafed PE Pseudoephedrine   Tylenol Cold & Sinus     Vicks Vapor Rub  Zyrtec  **AVOID if Problems With Blood Pressure         Heartburn: Avoid lying down for at least 1 hour after meals  Aciphex      Maalox     Rash:  Milk of Magnesia     Benadryl    Mylanta       1% Hydrocortisone Cream  Pepcid  Pepcid Complete   Sleep Aids:  Prevacid      Ambien   Prilosec       Benadryl  Rolaids       Chamomile Tea  Tums (Limit  4/day)     Unisom         Tylenol PM         Warm milk-add vanilla or  Hemorrhoids:       Sugar for taste  Anusol/Anusol H.C.  (RX: Analapram 2.5%)  Sugar Substitutes:  Hydrocortisone OTC     Ok in moderation  Preparation H      Tucks        Vaseline lotion applied to tissue with wiping    Herpes:     Throat:  Acyclovir      Oragel  Famvir  Valtrex     Vaccines:         Flu Shot Leg Cramps:       *Gardasil  Benadryl      Hepatitis A         Hepatitis B Nasal Spray:       Pneumovax  Saline Nasal Spray     Polio Booster         Tetanus Nausea:       Tuberculosis test or PPD  Vitamin B6 25 mg TID   AVOID:    Dramamine      *Gardasil  Emetrol       Live Poliovirus  Ginger Root 250 mg QID    MMR (measles, mumps &  High Complex Carbs @ Bedtime    rebella)  Sea Bands-Accupressure    Varicella (Chickenpox)  Unisom 1/2 tab TID     *No known complications           If received before Pain:         Known pregnancy;   Darvocet       Resume series after  Lortab        Delivery  Percocet    Yeast:   Tramadol      Femstat  Tylenol 3      Gyne-lotrimin  Ultram       Monistat  Vicodin           MISC:         All Sunscreens           Hair Coloring/highlights          Insect Repellant's          (Including DEET)         Mystic Tans  

## 2021-05-05 NOTE — Progress Notes (Signed)
NOB-PE- Pt present for initial prenatal care and physical. Pt stated that she was having abd pressure at night. Pt declined genetic testing.

## 2021-05-08 LAB — CYTOLOGY - PAP: Diagnosis: NEGATIVE

## 2021-05-29 ENCOUNTER — Ambulatory Visit (HOSPITAL_COMMUNITY): Payer: Managed Care, Other (non HMO)

## 2021-06-06 ENCOUNTER — Encounter: Payer: Managed Care, Other (non HMO) | Admitting: Certified Nurse Midwife

## 2021-06-09 ENCOUNTER — Encounter: Payer: Self-pay | Admitting: Certified Nurse Midwife

## 2021-06-09 ENCOUNTER — Other Ambulatory Visit: Payer: Self-pay

## 2021-06-09 ENCOUNTER — Ambulatory Visit (INDEPENDENT_AMBULATORY_CARE_PROVIDER_SITE_OTHER): Payer: Managed Care, Other (non HMO) | Admitting: Certified Nurse Midwife

## 2021-06-09 VITALS — BP 97/62 | HR 83 | Wt 105.6 lb

## 2021-06-09 DIAGNOSIS — Z3A17 17 weeks gestation of pregnancy: Secondary | ICD-10-CM

## 2021-06-09 DIAGNOSIS — Z3491 Encounter for supervision of normal pregnancy, unspecified, first trimester: Secondary | ICD-10-CM

## 2021-06-09 LAB — POCT URINALYSIS DIPSTICK
Bilirubin, UA: NEGATIVE
Blood, UA: NEGATIVE
Glucose, UA: NEGATIVE
Ketones, UA: NEGATIVE
Leukocytes, UA: NEGATIVE
Nitrite, UA: NEGATIVE
Protein, UA: NEGATIVE
Spec Grav, UA: 1.025 (ref 1.010–1.025)
Urobilinogen, UA: 0.2 E.U./dL
pH, UA: 6 (ref 5.0–8.0)

## 2021-06-09 NOTE — Patient Instructions (Signed)
Round Ligament Pain  The round ligament is a cord of muscle and tissue that helps support the uterus. It can become a source of pain during pregnancy if it becomes stretched or twisted as the baby grows. The pain usually begins in the second trimester (13-28 weeks) of pregnancy, and it can come and go until the baby is delivered.It is not a serious problem, and it does not cause harm to the baby. Round ligament pain is usually a short, sharp, and pinching pain, but it can also be a dull, lingering, and aching pain. The pain is felt in the lower side of the abdomen or in the groin. It usually starts deep in the groin and moves up to the outside of the hip area. The pain may occur when you: Suddenly change position, such as quickly going from a sitting to standing position. Roll over in bed. Cough or sneeze. Do physical activity. Follow these instructions at home:  Watch your condition for any changes. When the pain starts, relax. Then try any of these methods to help with the pain: Sitting down. Flexing your knees up to your abdomen. Lying on your side with one pillow under your abdomen and another pillow between your legs. Sitting in a warm bath for 15-20 minutes or until the pain goes away. Take over-the-counter and prescription medicines only as told by your health care provider. Move slowly when you sit down or stand up. Avoid long walks if they cause pain. Stop or reduce your physical activities if they cause pain. Keep all follow-up visits as told by your health care provider. This is important. Contact a health care provider if: Your pain does not go away with treatment. You feel pain in your back that you did not have before. Your medicine is not helping. Get help right away if: You have a fever or chills. You develop uterine contractions. You have vaginal bleeding. You have nausea or vomiting. You have diarrhea. You have pain when you urinate. Summary Round ligament pain is  felt in the lower abdomen or groin. It is usually a short, sharp, and pinching pain. It can also be a dull, lingering, and aching pain. This pain usually begins in the second trimester (13-28 weeks). It occurs because the uterus is stretching with the growing baby, and it is not harmful to the baby. You may notice the pain when you suddenly change position, when you cough or sneeze, or during physical activity. Relaxing, flexing your knees to your abdomen, lying on one side, or taking a warm bath may help to get rid of the pain. Get help from your health care provider if the pain does not go away or if you have vaginal bleeding, nausea, vomiting, diarrhea, or painful urination. This information is not intended to replace advice given to you by your health care provider. Make sure you discuss any questions you have with your healthcare provider. Document Revised: 11/22/2020 Document Reviewed: 05/25/2018 Elsevier Patient Education  2022 Elsevier Inc.  

## 2021-06-09 NOTE — Progress Notes (Signed)
Rob doing well, unsure of fetal movement at this time. She has felt some fluttering. Questions answered about weight gain. Discussed recommend wt gain based off starting BMI, she verbalizes understanding. Discussed anatomy u/s , scheduled for 7/13. Questions answered about traveling. ROB in 3 wk.   Doreene Burke, CNM

## 2021-06-12 ENCOUNTER — Ambulatory Visit: Payer: Managed Care, Other (non HMO)

## 2021-06-12 ENCOUNTER — Ambulatory Visit (HOSPITAL_COMMUNITY): Payer: Managed Care, Other (non HMO)

## 2021-06-13 ENCOUNTER — Ambulatory Visit (HOSPITAL_COMMUNITY): Payer: Managed Care, Other (non HMO)

## 2021-07-01 ENCOUNTER — Encounter: Payer: Self-pay | Admitting: Certified Nurse Midwife

## 2021-07-01 ENCOUNTER — Ambulatory Visit (INDEPENDENT_AMBULATORY_CARE_PROVIDER_SITE_OTHER): Payer: Managed Care, Other (non HMO) | Admitting: Certified Nurse Midwife

## 2021-07-01 ENCOUNTER — Other Ambulatory Visit: Payer: Self-pay

## 2021-07-01 ENCOUNTER — Encounter: Payer: Managed Care, Other (non HMO) | Admitting: Certified Nurse Midwife

## 2021-07-01 VITALS — BP 98/64 | HR 76 | Wt 109.2 lb

## 2021-07-01 DIAGNOSIS — Z3482 Encounter for supervision of other normal pregnancy, second trimester: Secondary | ICD-10-CM

## 2021-07-01 DIAGNOSIS — Z3A21 21 weeks gestation of pregnancy: Secondary | ICD-10-CM

## 2021-07-01 LAB — POCT URINALYSIS DIPSTICK OB
Bilirubin, UA: NEGATIVE
Blood, UA: NEGATIVE
Glucose, UA: NEGATIVE
Ketones, UA: NEGATIVE
Leukocytes, UA: NEGATIVE
Nitrite, UA: NEGATIVE
POC,PROTEIN,UA: NEGATIVE
Spec Grav, UA: 1.025 (ref 1.010–1.025)
Urobilinogen, UA: 0.2 E.U./dL
pH, UA: 6 (ref 5.0–8.0)

## 2021-07-01 NOTE — Progress Notes (Signed)
ROB doing well. Feeling movement. Discussed round ligament pain. Pt has concern regarding swelling after walks, Reassurance given, pt encouraged to hydrate and elevate feet after walks. Anatomy u/s scheduled tomorrow. Will follow up with results.   Doreene Burke, CNM

## 2021-07-01 NOTE — Patient Instructions (Signed)
Round Ligament Pain  The round ligament is a cord of muscle and tissue that helps support the uterus. It can become a source of pain during pregnancy if it becomes stretched or twisted as the baby grows. The pain usually begins in the second trimester (13-28 weeks) of pregnancy, and it can come and go until the baby is delivered.It is not a serious problem, and it does not cause harm to the baby. Round ligament pain is usually a short, sharp, and pinching pain, but it can also be a dull, lingering, and aching pain. The pain is felt in the lower side of the abdomen or in the groin. It usually starts deep in the groin and moves up to the outside of the hip area. The pain may occur when you: Suddenly change position, such as quickly going from a sitting to standing position. Roll over in bed. Cough or sneeze. Do physical activity. Follow these instructions at home:  Watch your condition for any changes. When the pain starts, relax. Then try any of these methods to help with the pain: Sitting down. Flexing your knees up to your abdomen. Lying on your side with one pillow under your abdomen and another pillow between your legs. Sitting in a warm bath for 15-20 minutes or until the pain goes away. Take over-the-counter and prescription medicines only as told by your health care provider. Move slowly when you sit down or stand up. Avoid long walks if they cause pain. Stop or reduce your physical activities if they cause pain. Keep all follow-up visits as told by your health care provider. This is important. Contact a health care provider if: Your pain does not go away with treatment. You feel pain in your back that you did not have before. Your medicine is not helping. Get help right away if: You have a fever or chills. You develop uterine contractions. You have vaginal bleeding. You have nausea or vomiting. You have diarrhea. You have pain when you urinate. Summary Round ligament pain is  felt in the lower abdomen or groin. It is usually a short, sharp, and pinching pain. It can also be a dull, lingering, and aching pain. This pain usually begins in the second trimester (13-28 weeks). It occurs because the uterus is stretching with the growing baby, and it is not harmful to the baby. You may notice the pain when you suddenly change position, when you cough or sneeze, or during physical activity. Relaxing, flexing your knees to your abdomen, lying on one side, or taking a warm bath may help to get rid of the pain. Get help from your health care provider if the pain does not go away or if you have vaginal bleeding, nausea, vomiting, diarrhea, or painful urination. This information is not intended to replace advice given to you by your health care provider. Make sure you discuss any questions you have with your healthcare provider. Document Revised: 11/22/2020 Document Reviewed: 05/25/2018 Elsevier Patient Education  2022 Elsevier Inc.  

## 2021-07-02 ENCOUNTER — Ambulatory Visit
Admission: RE | Admit: 2021-07-02 | Discharge: 2021-07-02 | Disposition: A | Discharge status: Home / Self Care | Payer: Managed Care, Other (non HMO) | Source: Ambulatory Visit | Attending: Certified Nurse Midwife | Admitting: Certified Nurse Midwife

## 2021-07-02 DIAGNOSIS — Z3491 Encounter for supervision of normal pregnancy, unspecified, first trimester: Secondary | ICD-10-CM | POA: Insufficient documentation

## 2021-07-23 ENCOUNTER — Ambulatory Visit (INDEPENDENT_AMBULATORY_CARE_PROVIDER_SITE_OTHER): Payer: Managed Care, Other (non HMO) | Admitting: Certified Nurse Midwife

## 2021-07-23 ENCOUNTER — Other Ambulatory Visit: Payer: Self-pay

## 2021-07-23 ENCOUNTER — Encounter: Payer: Self-pay | Admitting: Certified Nurse Midwife

## 2021-07-23 VITALS — BP 109/66 | HR 93 | Wt 113.9 lb

## 2021-07-23 DIAGNOSIS — Z3A24 24 weeks gestation of pregnancy: Secondary | ICD-10-CM

## 2021-07-23 DIAGNOSIS — Z3403 Encounter for supervision of normal first pregnancy, third trimester: Secondary | ICD-10-CM

## 2021-07-23 DIAGNOSIS — R7309 Other abnormal glucose: Secondary | ICD-10-CM

## 2021-07-23 LAB — POCT URINALYSIS DIPSTICK OB
Bilirubin, UA: NEGATIVE
Blood, UA: NEGATIVE
Glucose, UA: NEGATIVE
Ketones, UA: NEGATIVE
Leukocytes, UA: NEGATIVE
Nitrite, UA: NEGATIVE
POC,PROTEIN,UA: NEGATIVE
Spec Grav, UA: 1.025 (ref 1.010–1.025)
Urobilinogen, UA: 0.2 E.U./dL
pH, UA: 5 (ref 5.0–8.0)

## 2021-07-23 NOTE — Addendum Note (Signed)
Addended by: Lady Deutscher on: 07/23/2021 03:53 PM   Modules accepted: Orders

## 2021-07-23 NOTE — Patient Instructions (Signed)
Oral Glucose Tolerance Test During Pregnancy °Why am I having this test? °The oral glucose tolerance test (OGTT) is done to check how your body processes blood sugar (glucose). This is one of several tests used to diagnose diabetes that develops during pregnancy (gestational diabetes mellitus). Gestational diabetes is a short-term form of diabetes that some women develop while they are pregnant. It usually occurs during the second trimester of pregnancy and goes away after delivery. °Testing, or screening, for gestational diabetes usually occurs at weeks 24-28 of pregnancy. You may have the OGTT test after having a 1-hour glucose screening test if the results from that test indicate that you may have gestational diabetes. This test may also be needed if: °You have a history of gestational diabetes. °There is a history of giving birth to very large babies or of losing pregnancies (having stillbirths). °You have signs and symptoms of diabetes, such as: °Changes in your eyesight. °Tingling or numbness in your hands or feet. °Changes in hunger, thirst, and urination, and these are not explained by your pregnancy. °What is being tested? °This test measures the amount of glucose in your blood at different times during a period of 3 hours. This shows how well your body can process glucose. °What kind of sample is taken? °Blood samples are required for this test. They are usually collected by inserting a needle into a blood vessel. °How do I prepare for this test? °For 3 days before your test, eat normally. Have plenty of carbohydrate-rich foods. °Follow instructions from your health care provider about: °Eating or drinking restrictions on the day of the test. You may be asked not to eat or drink anything other than water (to fast) starting 8-10 hours before the test. °Changing or stopping your regular medicines. Some medicines may interfere with this test. °Tell a health care provider about: °All medicines you are taking,  including vitamins, herbs, eye drops, creams, and over-the-counter medicines. °Any blood disorders you have. °Any surgeries you have had. °Any medical conditions you have. °What happens during the test? °First, your blood glucose will be measured. This is referred to as your fasting blood glucose because you fasted before the test. Then, you will drink a glucose solution that contains a certain amount of glucose. Your blood glucose will be measured again 1, 2, and 3 hours after you drink the solution. °This test takes about 3 hours to complete. You will need to stay at the testing location during this time. During the testing period: °Do not eat or drink anything other than the glucose solution. °Do not exercise. °Do not use any products that contain nicotine or tobacco, such as cigarettes, e-cigarettes, and chewing tobacco. These can affect your test results. If you need help quitting, ask your health care provider. °The testing procedure may vary among health care providers and hospitals. °How are the results reported? °Your results will be reported as milligrams of glucose per deciliter of blood (mg/dL) or millimoles per liter (mmol/L). There is more than one source for screening and diagnosis reference values used to diagnose gestational diabetes. Your health care provider will compare your results to normal values that were established after testing a large group of people (reference values). Reference values may vary among labs and hospitals. For this test (Carpenter-Coustan), reference values are: °Fasting: 95 mg/dL (5.3 mmol/L). °1 hour: 180 mg/dL (10.0 mmol/L). °2 hour: 155 mg/dL (8.6 mmol/L). °3 hour: 140 mg/dL (7.8 mmol/L). °What do the results mean? °Results below the reference values are considered   normal. If two or more of your blood glucose levels are at or above the reference values, you may be diagnosed with gestational diabetes. If only one level is high, your health care provider may suggest  repeat testing or other tests to confirm a diagnosis. °Talk with your health care provider about what your results mean. °Questions to ask your health care provider °Ask your health care provider, or the department that is doing the test: °When will my results be ready? °How will I get my results? °What are my treatment options? °What other tests do I need? °What are my next steps? °Summary °The oral glucose tolerance test (OGTT) is one of several tests used to diagnose diabetes that develops during pregnancy (gestational diabetes mellitus). Gestational diabetes is a short-term form of diabetes that some women develop while they are pregnant. °You may have the OGTT test after having a 1-hour glucose screening test if the results from that test show that you may have gestational diabetes. You may also have this test if you have any symptoms or risk factors for this type of diabetes. °Talk with your health care provider about what your results mean. °This information is not intended to replace advice given to you by your health care provider. Make sure you discuss any questions you have with your health care provider. °Document Revised: 05/16/2020 Document Reviewed: 05/16/2020 °Elsevier Patient Education © 2022 Elsevier Inc. ° °

## 2021-07-23 NOTE — Progress Notes (Signed)
ROB doing well, feeling good fetal movement. Discussed 28 wk visit. She verbalizes and agrees. Has trip planned mid sept. Discussed precautions . Follow up 4 wk for glucose screen , ROB   Doreene Burke, CNM

## 2021-08-19 NOTE — Addendum Note (Signed)
Addended by: Marchelle Folks on: 08/19/2021 11:53 AM   Modules accepted: Orders

## 2021-08-20 ENCOUNTER — Other Ambulatory Visit: Payer: Managed Care, Other (non HMO)

## 2021-08-20 ENCOUNTER — Other Ambulatory Visit: Payer: Self-pay

## 2021-08-20 ENCOUNTER — Ambulatory Visit (INDEPENDENT_AMBULATORY_CARE_PROVIDER_SITE_OTHER): Payer: Managed Care, Other (non HMO) | Admitting: Certified Nurse Midwife

## 2021-08-20 ENCOUNTER — Encounter: Payer: Self-pay | Admitting: Certified Nurse Midwife

## 2021-08-20 VITALS — BP 108/65 | HR 88 | Wt 121.1 lb

## 2021-08-20 DIAGNOSIS — Z3A28 28 weeks gestation of pregnancy: Secondary | ICD-10-CM

## 2021-08-20 DIAGNOSIS — Z23 Encounter for immunization: Secondary | ICD-10-CM

## 2021-08-20 DIAGNOSIS — Z3403 Encounter for supervision of normal first pregnancy, third trimester: Secondary | ICD-10-CM

## 2021-08-20 LAB — POCT URINALYSIS DIPSTICK OB
Bilirubin, UA: NEGATIVE
Blood, UA: NEGATIVE
Glucose, UA: NEGATIVE
Ketones, UA: NEGATIVE
Leukocytes, UA: NEGATIVE
Nitrite, UA: NEGATIVE
POC,PROTEIN,UA: NEGATIVE
Spec Grav, UA: 1.025 (ref 1.010–1.025)
Urobilinogen, UA: 0.2 E.U./dL
pH, UA: 6.5 (ref 5.0–8.0)

## 2021-08-20 MED ORDER — TETANUS-DIPHTH-ACELL PERTUSSIS 5-2.5-18.5 LF-MCG/0.5 IM SUSY: 0.5000 mL | PREFILLED_SYRINGE | Freq: Once | INTRAMUSCULAR | Status: DC

## 2021-08-20 NOTE — Progress Notes (Signed)
ROB doing well. Feels good movement. 28 wk labs today: Glucose screen/RPR/CBC. Tdap out of stock will given next visit , Blood transfusion consent completed, all questions answered. Ready set baby reviewed, see check list for topics covered. Sample birth plan given, will follow up in upcoming visits. Discussed birth control after delivery, information pamphlet given.   Follow up 2 wk  ROB or sooner if needed.    Doreene Burke, CNM

## 2021-08-20 NOTE — Patient Instructions (Signed)
Td (Tetanus, Diphtheria) Vaccine: What You Need to Know 1. Why get vaccinated? Td vaccine can prevent tetanus and diphtheria. Tetanus enters the body through cuts or wounds. Diphtheria spreads from person to person. TETANUS (T) causes painful stiffening of the muscles. Tetanus can lead to serious health problems, including being unable to open the mouth, having trouble swallowing and breathing, or death. DIPHTHERIA (D) can lead to difficulty breathing, heart failure, paralysis, or death. 2. Td vaccine Td is only for children 7 years and older, adolescents, and adults.  Td is usually given as a booster dose every 10 years, or after 5 years in the case of a severe or dirty wound or burn. Another vaccine, called "Tdap," may be used instead of Td. Tdap protects against pertussis, also known as "whooping cough," in addition to tetanus anddiphtheria. Td may be given at the same time as other vaccines. 3. Talk with your health care provider Tell your vaccination provider if the person getting the vaccine: Has had an allergic reaction after a previous dose of any vaccine that protects against tetanus or diphtheria, or has any severe, life-threatening allergies Has ever had Guillain-Barr Syndrome (also called "GBS") Has had severe pain or swelling after a previous dose of any vaccine that protects against tetanus or diphtheria In some cases, your health care provider may decide to postpone Td vaccinationuntil a future visit. People with minor illnesses, such as a cold, may be vaccinated. People who are moderately or severely ill should usually wait until they recover beforegetting Td vaccine.  Your health care provider can give you more information. 4. Risks of a vaccine reaction Pain, redness, or swelling where the shot was given, mild fever, headache, feeling tired, and nausea, vomiting, diarrhea, or stomachache sometimes happen after Td vaccination. People sometimes faint after medical procedures,  including vaccination. Tellyour provider if you feel dizzy or have vision changes or ringing in the ears.  As with any medicine, there is a very remote chance of a vaccine causing asevere allergic reaction, other serious injury, or death. 5. What if there is a serious problem? An allergic reaction could occur after the vaccinated person leaves the clinic. If you see signs of a severe allergic reaction (hives, swelling of the face and throat, difficulty breathing, a fast heartbeat, dizziness, or weakness), call 9-1-1and get the person to the nearest hospital.  For other signs that concern you, call your health care provider.  Adverse reactions should be reported to the Vaccine Adverse Event Reporting System (VAERS). Your health care provider will usually file this report, or you can do it yourself. Visit the VAERS website at www.vaers.hhs.gov or call 1-800-822-7967. VAERS is only for reporting reactions, and VAERS staff members do not give medical advice. 6. The National Vaccine Injury Compensation Program The National Vaccine Injury Compensation Program (VICP) is a federal program that was created to compensate people who may have been injured by certain vaccines. Claims regarding alleged injury or death due to vaccination have a time limit for filing, which may be as short as two years. Visit the VICP website at www.hrsa.gov/vaccinecompensation or call 1-800-338-2382to learn about the program and about filing a claim. 7. How can I learn more? Ask your health care provider. Call your local or state health department. Visit the website of the Food and Drug Administration (FDA) for vaccine package inserts and additional information at www.fda.gov/vaccines-blood-biologics/vaccines. Contact the Centers for Disease Control and Prevention (CDC): Call 1-800-232-4636 (1-800-CDC-INFO) or Visit CDC's website at www.cdc.gov/vaccines. Vaccine Information Statement   Td (Tetanus, Diphtheria) Vaccine (07/26/2020) This  information is not intended to replace advice given to you by your health care provider. Make sure you discuss any questions you have with your healthcare provider. Document Revised: 09/12/2020 Document Reviewed: 09/12/2020 Elsevier Patient Education  2022 Elsevier Inc.  

## 2021-08-21 ENCOUNTER — Other Ambulatory Visit: Payer: Self-pay | Admitting: Certified Nurse Midwife

## 2021-08-21 LAB — CBC
Hematocrit: 31.4 % — ABNORMAL LOW (ref 34.0–46.6)
Hemoglobin: 10.2 g/dL — ABNORMAL LOW (ref 11.1–15.9)
MCH: 28 pg (ref 26.6–33.0)
MCHC: 32.5 g/dL (ref 31.5–35.7)
MCV: 86 fL (ref 79–97)
Platelets: 257 10*3/uL (ref 150–450)
RBC: 3.64 x10E6/uL — ABNORMAL LOW (ref 3.77–5.28)
RDW: 13.1 % (ref 11.7–15.4)
WBC: 10.2 10*3/uL (ref 3.4–10.8)

## 2021-08-21 LAB — GLUCOSE, 1 HOUR GESTATIONAL: Gestational Diabetes Screen: 136 mg/dL (ref 65–139)

## 2021-08-21 LAB — RPR: RPR Ser Ql: NONREACTIVE

## 2021-08-21 MED ORDER — FUSION PLUS PO CAPS: 1.0000 | ORAL_CAPSULE | Freq: Every day | ORAL | 9 refills | Status: DC

## 2021-09-04 NOTE — Patient Instructions (Signed)
Breastfeeding and Breast Care It is normal to have some problems when you start to breastfeed your new baby. But there are things that you can do to take care of yourself and help prevent problems. This includes keeping your breasts healthy and making sure that your baby's mouth attaches (latches) properly to your nipple for feedings. Work with your doctor or breastfeeding specialist to find what works best for you. How does self-care benefit me? If you keep your breasts healthy and you let your baby attach to your nipples in the right way, you will avoid these problems: Cracked or sore nipples. Breasts becoming overfilled with milk. Plugged milk ducts. Low milk supply. Breast swelling or infection. How does self-care benefit my baby? By preventing problems with your breasts, you will ensure that your baby will feed well and will gain the right amount of weight. What actions can I take to care for myself during breastfeeding? Best ways to breastfeed Always make sure that your baby latches properly to breastfeed. Make sure that your baby is in a proper position. Try different breastfeeding positions to find one that works best for you and your baby. Breastfeed when you feel like you need to make your breasts less full or when your baby shows signs of hunger. This is called "breastfeeding on demand." Do not delay feedings. Try to relax when it is time to feed your baby. This helps your body release milk from your breast. To help increase milk flow, do these things before feeding: Remove a small amount of milk from your breast. Use a pump or squeeze with your hand. Apply warm, moist heat to your breast. Do this in the shower or use hand towels soaked with warm water. Massage your breasts. Do this when you are breastfeeding as well. Caring for your breasts   To help your breasts stay healthy and keep them from getting too dry: Avoid using soap on your nipples. Let your nipples air-dry for 3-4  minutes after each feeding. Do not use things like a hair dryer to dry your breasts. This can make the skin dry and will cause irritation and pain. Use only cotton bra pads to soak up breast milk that leaks. Change the pads if they become soaked with milk. If you use bra pads that can be thrown away, change them often. Put some lanolin on your nipples after breastfeeding. Pure lanolin does not need to be washed off your nipple before you feed your baby again. Pure lanolin is not harmful to your baby. Rub some breast milk into your nipples: Use your hand to squeeze out a few drops of breast milk. Gently massage the milk into your nipples. Let your nipples air-dry. Wear a supportive nursing bra. Avoid wearing: Tight clothing. Underwire bras or bras that put pressure on your breasts. Use ice to help relieve pain or swelling of your breasts: Put ice in a plastic bag. Place a towel between your skin and the bag. Leave the ice on for 20 minutes, 2-3 times a day. Follow these instructions at home: Drink enough fluid to keep your pee (urine) pale yellow. Get plenty of rest. Sleep when your baby sleeps. Talk to your doctor or breastfeeding specialist before taking any herbal supplements. Eat a balanced diet. This includes fruits, vegetables, whole grains, lean proteins, and dairy or dairy alternatives Contact a health care provider if: You have nipple pain. You have cracking or soreness in your nipples that lasts longer than 1 week. Your breasts are  overfilled with milk, and this lasts longer than 48 hours. You have a fever. You have pus-like fluid coming from your nipple. You have redness, a rash, swelling, itching, or burning on your breast. Your baby does not gain weight. Your baby loses weight. Your baby is not feeding regularly or is very sleepy and lacks energy. Summary There are things that you can do to take care of yourself and help prevent many common breastfeeding problems. Always  make sure that your baby's mouth attaches (latches) to your nipple properly to breastfeed. Keep your nipples from getting too dry, drink plenty of fluid, and get plenty of rest. Feed on demand. Do not delay feedings. This information is not intended to replace advice given to you by your health care provider. Make sure you discuss any questions you have with your health care provider. Document Revised: 05/28/2020 Document Reviewed: 05/28/2020 Elsevier Patient Education  [place].    Common Medications Safe in Pregnancy  Acne:      Constipation:  Benzoyl Peroxide     Colace  Clindamycin      Dulcolax Suppository  Topica Erythromycin     Fibercon  Salicylic Acid      Metamucil         Miralax AVOID:        Senakot   Accutane    Cough:  Retin-A       Cough Drops  Tetracycline      Phenergan w/ Codeine if Rx  Minocycline      Robitussin (Plain & DM)  Antibiotics:     Crabs/Lice:  Ceclor       RID  Cephalosporins    AVOID:  E-Mycins      Kwell  Keflex  Macrobid/Macrodantin   Diarrhea:  Penicillin      Kao-Pectate  Zithromax      Imodium AD         PUSH FLUIDS AVOID:       Cipro     Fever:  Tetracycline      Tylenol (Regular or Extra  Minocycline       Strength)  Levaquin      Extra Strength-Do not          Exceed 8 tabs/24 hrs Caffeine:        <258m/day (equiv. To 1 cup of coffee or  approx. 3 12 oz sodas)         Gas: Cold/Hayfever:       Gas-X  Benadryl      Mylicon  Claritin       Phazyme  **Claritin-D        Chlor-Trimeton    Headaches:  Dimetapp      ASA-Free Excedrin  Drixoral-Non-Drowsy     Cold Compress  Mucinex (Guaifenasin)     Tylenol (Regular or Extra  Sudafed/Sudafed-12 Hour     Strength)  **Sudafed PE Pseudoephedrine   Tylenol Cold & Sinus     Vicks Vapor Rub  Zyrtec  **AVOID if Problems With Blood Pressure         Heartburn: Avoid lying down for at least 1 hour after meals  Aciphex      Maalox     Rash:  Milk of  Magnesia     Benadryl    Mylanta       1% Hydrocortisone Cream  Pepcid  Pepcid Complete   Sleep Aids:  Prevacid      Ambien   Prilosec       Benadryl  Rolaids       Chamomile Tea  Tums (Limit 4/day)     Unisom         Tylenol PM         Warm milk-add vanilla or  Hemorrhoids:       Sugar for taste  Anusol/Anusol H.C.  (RX: Analapram 2.5%)  Sugar Substitutes:  Hydrocortisone OTC     Ok in moderation  Preparation H      Tucks        Vaseline lotion applied to tissue with wiping    Herpes:     Throat:  Acyclovir      Oragel  Famvir  Valtrex     Vaccines:         Flu Shot Leg Cramps:       *Gardasil  Benadryl      Hepatitis A         Hepatitis B Nasal Spray:       Pneumovax  Saline Nasal Spray     Polio Booster         Tetanus Nausea:       Tuberculosis test or PPD  Vitamin B6 25 mg TID   AVOID:    Dramamine      *Gardasil  Emetrol       Live Poliovirus  Ginger Root 250 mg QID    MMR (measles, mumps &  High Complex Carbs @ Bedtime    rebella)  Sea Bands-Accupressure    Varicella (Chickenpox)  Unisom 1/2 tab TID     *No known complications           If received before Pain:         Known pregnancy;   Darvocet       Resume series after  Lortab        Delivery  Percocet    Yeast:   Tramadol      Femstat  Tylenol 3      Gyne-lotrimin  Ultram       Monistat  Vicodin           MISC:         All Sunscreens           Hair Coloring/highlights          Insect Repellant's          (Including DEET)         [facility] Tans

## 2021-09-05 ENCOUNTER — Ambulatory Visit (INDEPENDENT_AMBULATORY_CARE_PROVIDER_SITE_OTHER): Payer: Managed Care, Other (non HMO) | Admitting: Certified Nurse Midwife

## 2021-09-05 ENCOUNTER — Encounter: Payer: Self-pay | Admitting: Certified Nurse Midwife

## 2021-09-05 ENCOUNTER — Other Ambulatory Visit: Payer: Self-pay

## 2021-09-05 VITALS — BP 114/69 | HR 93 | Wt 121.0 lb

## 2021-09-05 DIAGNOSIS — Z23 Encounter for immunization: Secondary | ICD-10-CM | POA: Diagnosis not present

## 2021-09-05 DIAGNOSIS — Z3403 Encounter for supervision of normal first pregnancy, third trimester: Secondary | ICD-10-CM

## 2021-09-05 DIAGNOSIS — Z3A30 30 weeks gestation of pregnancy: Secondary | ICD-10-CM

## 2021-09-05 LAB — POCT URINALYSIS DIPSTICK OB
Bilirubin, UA: NEGATIVE
Blood, UA: NEGATIVE
Glucose, UA: NEGATIVE
Ketones, UA: NEGATIVE
Nitrite, UA: NEGATIVE
POC,PROTEIN,UA: NEGATIVE
Spec Grav, UA: 1.01 (ref 1.010–1.025)
Urobilinogen, UA: 0.2 E.U./dL
pH, UA: 7.5 (ref 5.0–8.0)

## 2021-09-05 MED ORDER — TETANUS-DIPHTH-ACELL PERTUSSIS 5-2.5-18.5 LF-MCG/0.5 IM SUSY
0.5000 mL | PREFILLED_SYRINGE | Freq: Once | INTRAMUSCULAR | Status: AC
  Administered 2021-09-05: 0.5 mL via INTRAMUSCULAR

## 2021-09-05 NOTE — Progress Notes (Signed)
   OB-Pt present for routine prenatal care. Pt stated fetal movement present; no  contractions present; no vaginal bleeding and watery vaginal discharge.   Flu vaccine declined; tdap vaccine given.

## 2021-09-05 NOTE — Progress Notes (Signed)
ROB doing well, Feeling good movement. Birth plan reviewed, plans epidural. Discussed follow up 2 wks   Doreene Burke, CNM

## 2021-09-19 ENCOUNTER — Encounter: Payer: Self-pay | Admitting: Certified Nurse Midwife

## 2021-09-19 ENCOUNTER — Ambulatory Visit (INDEPENDENT_AMBULATORY_CARE_PROVIDER_SITE_OTHER): Payer: Managed Care, Other (non HMO) | Admitting: Certified Nurse Midwife

## 2021-09-19 ENCOUNTER — Other Ambulatory Visit: Payer: Self-pay

## 2021-09-19 VITALS — BP 100/68 | HR 79 | Wt 122.7 lb

## 2021-09-19 DIAGNOSIS — Z3403 Encounter for supervision of normal first pregnancy, third trimester: Secondary | ICD-10-CM

## 2021-09-19 DIAGNOSIS — Z3A32 32 weeks gestation of pregnancy: Secondary | ICD-10-CM

## 2021-09-19 LAB — POCT URINALYSIS DIPSTICK OB
Bilirubin, UA: NEGATIVE
Blood, UA: NEGATIVE
Glucose, UA: NEGATIVE
Ketones, UA: NEGATIVE
Nitrite, UA: NEGATIVE
Spec Grav, UA: 1.01 (ref 1.010–1.025)
Urobilinogen, UA: 0.2 E.U./dL
pH, UA: 7 (ref 5.0–8.0)

## 2021-09-19 NOTE — Patient Instructions (Signed)
Fetal Movement Counts Patient Name: ________________________________________________ Patient DueDate: ____________________ What is a fetal movement count?  A fetal movement count is the number of times that you feel your baby move during a certain amount of time. This may also be called a fetal kick count. A fetal movement count is recommended for every pregnant woman. You may be askedto start counting fetal movements as early as week 28 of your pregnancy. Pay attention to when your baby is most active. You may notice your baby's sleep and wake cycles. You may also notice things that make your baby move more. You should do a fetal movement count: When your baby is normally most active. At the same time each day. A good time to count movements is while you are resting, after having somethingto eat and drink. How do I count fetal movements? Find a quiet, comfortable area. Sit, or lie down on your side. Write down the date, the start time and stop time, and the number of movements that you felt between those two times. Take this information with you to your health care visits. Write down your start time when you feel the first movement. Count kicks, flutters, swishes, rolls, and jabs. You should feel at least 10 movements. You may stop counting after you have felt 10 movements, or if you have been counting for 2 hours. Write down the stop time. If you do not feel 10 movements in 2 hours, contact your health care provider for further instructions. Your health care provider may want to do additional tests to assess your baby's well-being. Contact a health care provider if: You feel fewer than 10 movements in 2 hours. Your baby is not moving like he or she usually does. Date: ____________ Start time: ____________ Stop time: ____________ Movements:____________ Date: ____________ Start time: ____________ Stop time: ____________ Movements:____________ Date: ____________ Start time: ____________ Stop  time: ____________ Movements:____________ Date: ____________ Start time: ____________ Stop time: ____________ Movements:____________ Date: ____________ Start time: ____________ Stop time: ____________ Movements:____________ Date: ____________ Start time: ____________ Stop time: ____________ Movements:____________ Date: ____________ Start time: ____________ Stop time: ____________ Movements:____________ Date: ____________ Start time: ____________ Stop time: ____________ Movements:____________ Date: ____________ Start time: ____________ Stop time: ____________ Movements:____________ This information is not intended to replace advice given to you by your health care provider. Make sure you discuss any questions you have with your healthcare provider. Document Revised: 07/27/2019 Document Reviewed: 07/27/2019 Elsevier Patient Education  2022 Elsevier Inc.  

## 2021-09-19 NOTE — Progress Notes (Signed)
ROB dong well, feeling regular fetal movement. She is planning condoms for contraception post delivery. Discussed MD coverage of call . Pt is interested in meeting MDs. She will see them next visit then see me for 36 wk ,then other MD at 37 wk visit. She is in agreement to plan. Follow up as discussed.   Doreene Burke, CNM

## 2021-09-19 NOTE — Progress Notes (Signed)
ROB: Doing well. No concerns.

## 2021-09-30 ENCOUNTER — Ambulatory Visit (INDEPENDENT_AMBULATORY_CARE_PROVIDER_SITE_OTHER): Payer: Managed Care, Other (non HMO) | Admitting: Certified Nurse Midwife

## 2021-09-30 ENCOUNTER — Other Ambulatory Visit: Payer: Self-pay

## 2021-09-30 VITALS — BP 100/63 | HR 73 | Wt 124.5 lb

## 2021-09-30 DIAGNOSIS — Z3A34 34 weeks gestation of pregnancy: Secondary | ICD-10-CM

## 2021-09-30 DIAGNOSIS — Z3403 Encounter for supervision of normal first pregnancy, third trimester: Secondary | ICD-10-CM

## 2021-09-30 LAB — POCT URINALYSIS DIPSTICK OB
Bilirubin, UA: NEGATIVE
Blood, UA: NEGATIVE
Glucose, UA: NEGATIVE
Ketones, UA: NEGATIVE
Leukocytes, UA: NEGATIVE
Nitrite, UA: NEGATIVE
POC,PROTEIN,UA: NEGATIVE
Spec Grav, UA: 1.01 (ref 1.010–1.025)
Urobilinogen, UA: 0.2 E.U./dL
pH, UA: 8 (ref 5.0–8.0)

## 2021-09-30 NOTE — Progress Notes (Signed)
ROB doing well, Feeling regular fetal movement. Has next appointment scheduled with me , the the next 2 visit with MD to meet them. Discussed GBS testing . She verbalizes and agree to plan.Birth plan reviewed. Plans epidural, few vag exam, few interruptions, labor down and push spontaneously, dely cord clamp, natural del placenta, No hep B, no vit K, no bath for baby. Scanned to chart. All questions answered follow up 2 wk as scheduled for ROB.   Doreene Burke, CNM

## 2021-09-30 NOTE — Patient Instructions (Signed)

## 2021-10-14 ENCOUNTER — Ambulatory Visit (INDEPENDENT_AMBULATORY_CARE_PROVIDER_SITE_OTHER): Payer: Managed Care, Other (non HMO) | Admitting: Certified Nurse Midwife

## 2021-10-14 ENCOUNTER — Other Ambulatory Visit: Payer: Self-pay

## 2021-10-14 VITALS — BP 128/88 | HR 90 | Wt 126.8 lb

## 2021-10-14 DIAGNOSIS — Z3A36 36 weeks gestation of pregnancy: Secondary | ICD-10-CM

## 2021-10-14 DIAGNOSIS — Z3403 Encounter for supervision of normal first pregnancy, third trimester: Secondary | ICD-10-CM

## 2021-10-14 LAB — POCT URINALYSIS DIPSTICK OB
Bilirubin, UA: NEGATIVE
Blood, UA: NEGATIVE
Glucose, UA: NEGATIVE
Ketones, UA: NEGATIVE
Leukocytes, UA: NEGATIVE
Nitrite, UA: NEGATIVE
POC,PROTEIN,UA: NEGATIVE
Spec Grav, UA: <=1.005 — AB (ref 1.010–1.025)
Urobilinogen, UA: 0.2 E.U./dL
pH, UA: 7 (ref 5.0–8.0)

## 2021-10-14 NOTE — Progress Notes (Signed)
ROB doing well, feeling regular movement. Herbal prep handout given and reviewed with pt. She will see DR. Cherry next week then Dr. Logan Bores so she can meet them both. GBS and cultures collected today. Follow up 1 wk ROB.   Megan Mack, CNM

## 2021-10-14 NOTE — Patient Instructions (Signed)

## 2021-10-16 LAB — STREP GP B NAA: Strep Gp B NAA: NEGATIVE

## 2021-10-16 LAB — GC/CHLAMYDIA PROBE AMP
Chlamydia trachomatis, NAA: NEGATIVE
Neisseria Gonorrhoeae by PCR: NEGATIVE

## 2021-10-20 NOTE — Patient Instructions (Addendum)
Common Medications Safe in Pregnancy  Acne:      Constipation:  Benzoyl Peroxide     Colace  Clindamycin      Dulcolax Suppository  Topica Erythromycin     Fibercon  Salicylic Acid      Metamucil         Miralax AVOID:        Senakot   Accutane    Cough:  Retin-A       Cough Drops  Tetracycline      Phenergan w/ Codeine if Rx  Minocycline      Robitussin (Plain & DM)  Antibiotics:     Crabs/Lice:  Ceclor       RID  Cephalosporins    AVOID:  E-Mycins      Kwell  Keflex  Macrobid/Macrodantin   Diarrhea:  Penicillin      Kao-Pectate  Zithromax      Imodium AD         PUSH FLUIDS AVOID:       Cipro     Fever:  Tetracycline      Tylenol (Regular or Extra  Minocycline       Strength)  Levaquin      Extra Strength-Do not          Exceed 8 tabs/24 hrs Caffeine:        <200mg/day (equiv. To 1 cup of coffee or  approx. 3 12 oz sodas)         Gas: Cold/Hayfever:       Gas-X  Benadryl      Mylicon  Claritin       Phazyme  **Claritin-D        Chlor-Trimeton    Headaches:  Dimetapp      ASA-Free Excedrin  Drixoral-Non-Drowsy     Cold Compress  Mucinex (Guaifenasin)     Tylenol (Regular or Extra  Sudafed/Sudafed-12 Hour     Strength)  **Sudafed PE Pseudoephedrine   Tylenol Cold & Sinus     Vicks Vapor Rub  Zyrtec  **AVOID if Problems With Blood Pressure         Heartburn: Avoid lying down for at least 1 hour after meals  Aciphex      Maalox     Rash:  Milk of Magnesia     Benadryl    Mylanta       1% Hydrocortisone Cream  Pepcid  Pepcid Complete   Sleep Aids:  Prevacid      Ambien   Prilosec       Benadryl  Rolaids       Chamomile Tea  Tums (Limit 4/day)     Unisom         Tylenol PM         Warm milk-add vanilla or  Hemorrhoids:       Sugar for taste  Anusol/Anusol H.C.  (RX: Analapram 2.5%)  Sugar Substitutes:  Hydrocortisone OTC     Ok in moderation  Preparation H      Tucks        Vaseline lotion applied to tissue with  wiping    Herpes:     Throat:  Acyclovir      Oragel  Famvir  Valtrex     Vaccines:         Flu Shot Leg Cramps:       *Gardasil  Benadryl      Hepatitis A         Hepatitis B Nasal Spray:         Pneumovax  Saline Nasal Spray     Polio Booster         Tetanus Nausea:       Tuberculosis test or PPD  Vitamin B6 25 mg TID   AVOID:    Dramamine      *Gardasil  Emetrol       Live Poliovirus  Ginger Root 250 mg QID    MMR (measles, mumps &  High Complex Carbs @ Bedtime    rebella)  Sea Bands-Accupressure    Varicella (Chickenpox)  Unisom 1/2 tab TID     *No known complications           If received before Pain:         Known pregnancy;   Darvocet       Resume series after  Lortab        Delivery  Percocet    Yeast:   Tramadol      Femstat  Tylenol 3      Gyne-lotrimin  Ultram       Monistat  Vicodin           MISC:         All Sunscreens           Hair Coloring/highlights          Insect Repellant's          (Including DEET)         [facility]  [place], [place], [place] [address]  Phone: [phone]  [facility] (second location)  [address] [place], [place] [address]  Phone: [phone]  [facility]) [place], [place], [place] [address] Phone: [phone]  [facility] [address] [place], [place] [address]  Phone: [phone]

## 2021-10-21 ENCOUNTER — Ambulatory Visit (INDEPENDENT_AMBULATORY_CARE_PROVIDER_SITE_OTHER): Payer: Managed Care, Other (non HMO) | Admitting: Obstetrics and Gynecology

## 2021-10-21 ENCOUNTER — Other Ambulatory Visit: Payer: Self-pay

## 2021-10-21 ENCOUNTER — Encounter: Payer: Self-pay | Admitting: Obstetrics and Gynecology

## 2021-10-21 VITALS — BP 108/70 | HR 105 | Wt 128.7 lb

## 2021-10-21 DIAGNOSIS — Z3403 Encounter for supervision of normal first pregnancy, third trimester: Secondary | ICD-10-CM

## 2021-10-21 DIAGNOSIS — Z3A36 36 weeks gestation of pregnancy: Secondary | ICD-10-CM

## 2021-10-21 LAB — POCT URINALYSIS DIPSTICK OB
Bilirubin, UA: NEGATIVE
Blood, UA: NEGATIVE
Glucose, UA: NEGATIVE
Ketones, UA: NEGATIVE
Nitrite, UA: NEGATIVE
Spec Grav, UA: 1.025 (ref 1.010–1.025)
Urobilinogen, UA: 0.2 E.U./dL
pH, UA: 6 (ref 5.0–8.0)

## 2021-10-21 NOTE — Progress Notes (Signed)
ROB: "Meet and Greet with MD" today. Doing well, no issues.  Reviewed 36 week cultures, negative. Discussed labor precautions.  Discussed birth plan (plans for epidural). Given Pediatrician list. Have questions regarding Hepatitis B vaccine for newborn. Discussed and given handout. RTC in 1 week to meet Dr. Logan Bores.

## 2021-10-21 NOTE — Progress Notes (Signed)
OB-Pt present for routine prenatal care. Pt stated that she was doing well.  

## 2021-10-27 NOTE — Patient Instructions (Signed)

## 2021-10-28 ENCOUNTER — Encounter: Payer: Self-pay | Admitting: Obstetrics and Gynecology

## 2021-10-28 ENCOUNTER — Other Ambulatory Visit: Payer: Self-pay

## 2021-10-28 ENCOUNTER — Ambulatory Visit (INDEPENDENT_AMBULATORY_CARE_PROVIDER_SITE_OTHER): Payer: Managed Care, Other (non HMO) | Admitting: Obstetrics and Gynecology

## 2021-10-28 VITALS — BP 100/54 | HR 94 | Wt 132.5 lb

## 2021-10-28 DIAGNOSIS — Z3403 Encounter for supervision of normal first pregnancy, third trimester: Secondary | ICD-10-CM

## 2021-10-28 DIAGNOSIS — Z3A38 38 weeks gestation of pregnancy: Secondary | ICD-10-CM

## 2021-10-28 LAB — POCT URINALYSIS DIPSTICK OB
Bilirubin, UA: NEGATIVE
Blood, UA: NEGATIVE
Glucose, UA: NEGATIVE
Ketones, UA: NEGATIVE
Leukocytes, UA: NEGATIVE
Nitrite, UA: NEGATIVE
POC,PROTEIN,UA: NEGATIVE
Spec Grav, UA: <=1.005 — AB (ref 1.010–1.025)
Urobilinogen, UA: 0.2 E.U./dL
pH, UA: 7.5 (ref 5.0–8.0)

## 2021-10-28 NOTE — Progress Notes (Signed)
ROB: No complaints.  Signs and symptoms of labor discussed in detail.  All questions answered.

## 2021-10-28 NOTE — Progress Notes (Signed)
OB-Pt present for routine prenatal care. Pt stated that she was doing well and denies any issues at this time. Pt declined flu vaccine.

## 2021-11-01 ENCOUNTER — Observation Stay
Admission: EM | Admit: 2021-11-01 | Discharge: 2021-11-01 | Disposition: A | Discharge status: Home / Self Care | Payer: Managed Care, Other (non HMO) | Attending: Certified Nurse Midwife | Admitting: Certified Nurse Midwife

## 2021-11-01 ENCOUNTER — Other Ambulatory Visit: Payer: Self-pay

## 2021-11-01 ENCOUNTER — Encounter: Payer: Self-pay | Admitting: Obstetrics and Gynecology

## 2021-11-01 DIAGNOSIS — O26853 Spotting complicating pregnancy, third trimester: Secondary | ICD-10-CM | POA: Diagnosis present

## 2021-11-01 DIAGNOSIS — Z3A38 38 weeks gestation of pregnancy: Secondary | ICD-10-CM | POA: Insufficient documentation

## 2021-11-01 NOTE — Progress Notes (Signed)
Pt is a G1P0 and [redacted]w[redacted]d presenting to L&D with c/o a "teaspoon size of bleeding and 3 clots the size of a pencil eraser" at 2145. Pt reports that she has not had to put on a pad and there is just "a light pink" amount when she wiped in the triage bathroom. Pt reports good fetal movement and denies LOF, ctx and pain. VSS. Monitors applied and assessing and pt wearing new pad.

## 2021-11-01 NOTE — OB Triage Note (Signed)
Discharge instructions reviewed and pt verbalized understanding. No more bleeding episodes have occurred. Pt stable at the time of discharge and labor precautions and when to return for evaluation reviewed.

## 2021-11-01 NOTE — OB Triage Note (Signed)
    L&D OB Triage Note  SUBJECTIVE Megan Mack is a 30 y.o. G1P0000 female at [redacted]w[redacted]d, EDD Estimated Date of Delivery: 11/11/21 who presented to triage with complaints of some blood with wiping approximately teaspoon with a few small clots. At 2145 . She denies intercourse, feels good movement, and denies contractions.    OB History  Gravida Para Term Preterm AB Living  1 0 0 0 0 0  SAB IAB Ectopic Multiple Live Births  0 0 0 0 0    # Outcome Date GA Lbr Len/2nd Weight Sex Delivery Anes PTL Lv  1 Current             Medications Prior to Admission  Medication Sig Dispense Refill Last Dose   calcium gluconate 500 MG tablet Take 1 tablet by mouth 3 (three) times daily.   Past Week   Iron-FA-B Cmp-C-Biot-Probiotic (FUSION PLUS) CAPS Take 1 tablet by mouth daily. 30 capsule 9 10/31/2021   magnesium 30 MG tablet Take 30 mg by mouth 2 (two) times daily.   10/31/2021   metoprolol tartrate (LOPRESSOR) 25 MG tablet Take by mouth.   Past Week   Prenatal Vit-Fe Fumarate-FA (MULTIVITAMIN-PRENATAL) 27-0.8 MG TABS tablet Take 1 tablet by mouth daily at 12 noon.   10/31/2021   inFLIXimab (REMICADE IV) Inject into the vein. (Patient not taking: Reported on 11/01/2021)   Completed Course     OBJECTIVE  Nursing Evaluation:   BP 109/71 (BP Location: Right Arm)   Pulse (!) 101   Temp 98.1 F (36.7 C) (Oral)   Resp 16   Ht 5' (1.524 m)   Wt 59.9 kg   LMP 02/04/2021   BMI 25.78 kg/m    Findings:   no blood on exam by RN           NST was performed and has been reviewed by me.  NST INTERPRETATION: Category I   Baseline 130 Moderate variability  Accelerations present Decelerations absent    Ctx: irritability    ASSESSMENT Impression:  1.  Pregnancy:  G1P0000 at [redacted]w[redacted]d , EDD Estimated Date of Delivery: 11/11/21 2.  Reassuring fetal and maternal status 3. Not in labor, no signs of bleeding   PLAN 1. Current condition and above findings reviewed.  Reassuring fetal and maternal  condition. 2. Discharge home with standard labor precautions given to return to L&D or call the office for problems. 3. Continue routine prenatal care.  Doreene Burke, CNM

## 2021-11-05 ENCOUNTER — Ambulatory Visit (INDEPENDENT_AMBULATORY_CARE_PROVIDER_SITE_OTHER): Payer: Managed Care, Other (non HMO) | Admitting: Certified Nurse Midwife

## 2021-11-05 ENCOUNTER — Other Ambulatory Visit: Payer: Self-pay

## 2021-11-05 VITALS — BP 116/76 | HR 91 | Wt 133.9 lb

## 2021-11-05 DIAGNOSIS — Z3A39 39 weeks gestation of pregnancy: Secondary | ICD-10-CM

## 2021-11-05 DIAGNOSIS — Z3403 Encounter for supervision of normal first pregnancy, third trimester: Secondary | ICD-10-CM

## 2021-11-05 LAB — POCT URINALYSIS DIPSTICK OB
Bilirubin, UA: NEGATIVE
Blood, UA: NEGATIVE
Glucose, UA: NEGATIVE
Ketones, UA: NEGATIVE
Leukocytes, UA: NEGATIVE
Nitrite, UA: NEGATIVE
POC,PROTEIN,UA: NEGATIVE
Spec Grav, UA: 1.01 (ref 1.010–1.025)
Urobilinogen, UA: 0.2 E.U./dL
pH, UA: 6.5 (ref 5.0–8.0)

## 2021-11-05 NOTE — Patient Instructions (Signed)
Braxton Hicks Contractions Contractions of the uterus can occur throughout pregnancy, but they are not always a sign that you are in labor. You may have practice contractions called Braxton Hicks contractions. These false labor contractions are sometimes confused with true labor. What are Braxton Hicks contractions? Braxton Hicks contractions are tightening movements that occur in the muscles of the uterus before labor. Unlike true labor contractions, these contractions do not result in opening (dilation) and thinning of the lowest part of the uterus (cervix). Toward the end of pregnancy (32-34 weeks), Braxton Hicks contractions can happen more often and may become stronger. These contractions are sometimes difficult to tell apart from true labor because they can be very uncomfortable. How to tell the difference between true labor and false labor True labor Contractions last 30-70 seconds. Contractions become very regular. Discomfort is usually felt in the top of the uterus, and it spreads to the lower abdomen and low back. Contractions do not go away with walking. Contractions usually become stronger and more frequent. The cervix dilates and gets thinner. False labor Contractions are usually shorter, weaker, and farther apart than true labor contractions. Contractions are usually irregular. Contractions are often felt in the front of the lower abdomen and in the groin. Contractions may go away when you walk around or change positions while lying down. The cervix usually does not dilate or become thin. Sometimes, the only way to tell if you are in true labor is for your health care provider to look for changes in your cervix. Your health care provider will do a physical exam and may monitor your contractions. If you are in true labor, your health care provider will send you home with instructions about when to return to the hospital. You may continue to have Braxton Hicks contractions until you  go into true labor. Follow these instructions at home:  Take over-the-counter and prescription medicines only as told by your health care provider. If Braxton Hicks contractions are making you uncomfortable: Change your position from lying down or resting to walking, or change from walking to resting. Sit and rest in a tub of warm water. Drink enough fluid to keep your urine pale yellow. Dehydration may cause these contractions. Do slow and deep breathing several times an hour. Keep all follow-up visits. This is important. Contact a health care provider if: You have a fever. You have continuous pain in your abdomen. Your contractions become stronger, more regular, and closer together. You pass blood-tinged mucus. Get help right away if: You have fluid leaking or gushing from your vagina. You have bright red blood coming from your vagina. Your baby is not moving inside you as much as it used to. Summary You may have practice contractions called Braxton Hicks contractions. These false labor contractions are sometimes confused with true labor. Braxton Hicks contractions are usually shorter, weaker, farther apart, and less regular than true labor contractions. True labor contractions usually become stronger, more regular, and more frequent. Manage discomfort from Braxton Hicks contractions by changing position, resting in a warm bath, practicing deep breathing, and drinking plenty of water. Keep all follow-up visits. Contact your health care provider if your contractions become stronger, more regular, and closer together. This information is not intended to replace advice given to you by your health care provider. Make sure you discuss any questions you have with your health care provider. Document Revised: 10/14/2020 Document Reviewed: 10/14/2020 Elsevier Patient Education  2022 Elsevier Inc.  

## 2021-11-05 NOTE — Progress Notes (Signed)
ROB doing well, feeling good movement. Labor precautions reviewed. Discussed NST next visit due to being 40 wks. SVE per pt request 1/80/-2 .   Follow up 1 wk nst and rob.  Doreene Burke, CNM

## 2021-11-07 ENCOUNTER — Encounter: Payer: Self-pay | Admitting: Certified Nurse Midwife

## 2021-11-11 ENCOUNTER — Ambulatory Visit (INDEPENDENT_AMBULATORY_CARE_PROVIDER_SITE_OTHER): Payer: Managed Care, Other (non HMO) | Admitting: Certified Nurse Midwife

## 2021-11-11 ENCOUNTER — Other Ambulatory Visit: Payer: Self-pay

## 2021-11-11 ENCOUNTER — Other Ambulatory Visit: Payer: Managed Care, Other (non HMO)

## 2021-11-11 ENCOUNTER — Encounter: Payer: Self-pay | Admitting: Certified Nurse Midwife

## 2021-11-11 VITALS — BP 113/71 | HR 82 | Wt 134.7 lb

## 2021-11-11 DIAGNOSIS — Z3A40 40 weeks gestation of pregnancy: Secondary | ICD-10-CM

## 2021-11-11 DIAGNOSIS — Z3403 Encounter for supervision of normal first pregnancy, third trimester: Secondary | ICD-10-CM

## 2021-11-11 LAB — POCT URINALYSIS DIPSTICK OB
Bilirubin, UA: NEGATIVE
Blood, UA: NEGATIVE
Glucose, UA: NEGATIVE
Ketones, UA: NEGATIVE
Leukocytes, UA: NEGATIVE
Nitrite, UA: NEGATIVE
POC,PROTEIN,UA: NEGATIVE
Spec Grav, UA: 1.01 (ref 1.010–1.025)
Urobilinogen, UA: 0.2 E.U./dL
pH, UA: 7.5 (ref 5.0–8.0)

## 2021-11-11 LAB — FETAL NONSTRESS TEST

## 2021-11-11 NOTE — Progress Notes (Signed)
ROB and NST for post dates.SVE 1-2/70/-2 . Disucssed induction 41 wks she is in agreement.  NST reactive Baseline 130 accel presne decel absent Mod variability   Ctx occ.   Follow up for u/s growth /BPP Monday or Tuesday.   Doreene Burke, CNM

## 2021-11-11 NOTE — Patient Instructions (Signed)
Braxton Hicks Contractions Contractions of the uterus can occur throughout pregnancy, but they are not always a sign that you are in labor. You may have practice contractions called Braxton Hicks contractions. These false labor contractions are sometimes confused with true labor. What are Braxton Hicks contractions? Braxton Hicks contractions are tightening movements that occur in the muscles of the uterus before labor. Unlike true labor contractions, these contractions do not result in opening (dilation) and thinning of the lowest part of the uterus (cervix). Toward the end of pregnancy (32-34 weeks), Braxton Hicks contractions can happen more often and may become stronger. These contractions are sometimes difficult to tell apart from true labor because they can be very uncomfortable. How to tell the difference between true labor and false labor True labor Contractions last 30-70 seconds. Contractions become very regular. Discomfort is usually felt in the top of the uterus, and it spreads to the lower abdomen and low back. Contractions do not go away with walking. Contractions usually become stronger and more frequent. The cervix dilates and gets thinner. False labor Contractions are usually shorter, weaker, and farther apart than true labor contractions. Contractions are usually irregular. Contractions are often felt in the front of the lower abdomen and in the groin. Contractions may go away when you walk around or change positions while lying down. The cervix usually does not dilate or become thin. Sometimes, the only way to tell if you are in true labor is for your health care provider to look for changes in your cervix. Your health care provider will do a physical exam and may monitor your contractions. If you are in true labor, your health care provider will send you home with instructions about when to return to the hospital. You may continue to have Braxton Hicks contractions until you  go into true labor. Follow these instructions at home:  Take over-the-counter and prescription medicines only as told by your health care provider. If Braxton Hicks contractions are making you uncomfortable: Change your position from lying down or resting to walking, or change from walking to resting. Sit and rest in a tub of warm water. Drink enough fluid to keep your urine pale yellow. Dehydration may cause these contractions. Do slow and deep breathing several times an hour. Keep all follow-up visits. This is important. Contact a health care provider if: You have a fever. You have continuous pain in your abdomen. Your contractions become stronger, more regular, and closer together. You pass blood-tinged mucus. Get help right away if: You have fluid leaking or gushing from your vagina. You have bright red blood coming from your vagina. Your baby is not moving inside you as much as it used to. Summary You may have practice contractions called Braxton Hicks contractions. These false labor contractions are sometimes confused with true labor. Braxton Hicks contractions are usually shorter, weaker, farther apart, and less regular than true labor contractions. True labor contractions usually become stronger, more regular, and more frequent. Manage discomfort from Braxton Hicks contractions by changing position, resting in a warm bath, practicing deep breathing, and drinking plenty of water. Keep all follow-up visits. Contact your health care provider if your contractions become stronger, more regular, and closer together. This information is not intended to replace advice given to you by your health care provider. Make sure you discuss any questions you have with your health care provider. Document Revised: 10/14/2020 Document Reviewed: 10/14/2020 Elsevier Patient Education  2022 Elsevier Inc.  

## 2021-11-12 NOTE — Addendum Note (Signed)
Addended by: Mechele Claude on: 11/12/2021 08:09 AM   Modules accepted: Orders

## 2021-11-18 ENCOUNTER — Other Ambulatory Visit
Admission: RE | Admit: 2021-11-18 | Discharge: 2021-11-18 | Disposition: A | Discharge status: Home / Self Care | Payer: Managed Care, Other (non HMO) | Source: Ambulatory Visit | Attending: Certified Nurse Midwife | Admitting: Certified Nurse Midwife

## 2021-11-18 ENCOUNTER — Ambulatory Visit (INDEPENDENT_AMBULATORY_CARE_PROVIDER_SITE_OTHER): Payer: Managed Care, Other (non HMO) | Admitting: Certified Nurse Midwife

## 2021-11-18 ENCOUNTER — Other Ambulatory Visit: Payer: Managed Care, Other (non HMO)

## 2021-11-18 ENCOUNTER — Other Ambulatory Visit: Payer: Self-pay

## 2021-11-18 VITALS — BP 118/71 | HR 86 | Wt 134.3 lb

## 2021-11-18 DIAGNOSIS — Z3403 Encounter for supervision of normal first pregnancy, third trimester: Secondary | ICD-10-CM

## 2021-11-18 DIAGNOSIS — Z20822 Contact with and (suspected) exposure to covid-19: Secondary | ICD-10-CM

## 2021-11-18 DIAGNOSIS — O480 Post-term pregnancy: Secondary | ICD-10-CM | POA: Diagnosis not present

## 2021-11-18 DIAGNOSIS — Z01812 Encounter for preprocedural laboratory examination: Secondary | ICD-10-CM | POA: Insufficient documentation

## 2021-11-18 DIAGNOSIS — Z3A41 41 weeks gestation of pregnancy: Secondary | ICD-10-CM | POA: Diagnosis not present

## 2021-11-18 LAB — POCT URINALYSIS DIPSTICK OB
Bilirubin, UA: NEGATIVE
Blood, UA: NEGATIVE
Glucose, UA: NEGATIVE
Ketones, UA: NEGATIVE
Leukocytes, UA: NEGATIVE
Nitrite, UA: NEGATIVE
POC,PROTEIN,UA: NEGATIVE
Spec Grav, UA: 1.015 (ref 1.010–1.025)
Urobilinogen, UA: 0.2 E.U./dL
pH, UA: 7 (ref 5.0–8.0)

## 2021-11-18 NOTE — Progress Notes (Signed)
ROB and NST for post dates. Induction scheduled for midnight tonight covid testing this mornging. SVE 2/70/-2 . Discussed cytotec, foley bulb/cook cath, pitocin /AROM as mode of inducton. All questions answered. Given that pt is 2 cm will use cytotec to start. She and her partner are in agreement. Follow up as scheduled for induction in AM.   NST Reactive  Baseline 140 Accel present Decel absent Moderate varirabiliy  CTX abs.

## 2021-11-18 NOTE — Patient Instructions (Signed)
Labor Induction ?Labor induction is when steps are taken to cause a pregnant woman to begin the labor process. Most women go into labor on their own between 37 weeks and 42 weeks of pregnancy. When this does not happen, or when there is a medical need for labor to begin, steps may be taken to induce, or bring on, labor. ?Labor induction causes a pregnant woman's uterus to contract. It also causes the cervix to soften (ripen), open (dilate), and thin out. Usually, labor is not induced before 39 weeks of pregnancy unless there is a medical reason to do so. ?When is labor induction considered? ?Labor induction may be right for you if: ?Your pregnancy lasts longer than 41 to 42 weeks. ?Your placenta is separating from your uterus (placental abruption). ?You have a rupture of membranes and your labor does not begin. ?You have health problems, like diabetes or high blood pressure (preeclampsia) during your pregnancy. ?Your baby has stopped growing or does not have enough amniotic fluid. ?Before labor induction begins, your health care provider will consider the following factors: ?Your medical condition and the baby's condition. ?How many weeks you have been pregnant. ?How mature the baby's lungs are. ?The condition of your cervix. ?The position of the baby. ?The size of your birth canal. ?Tell a health care provider about: ?Any allergies you have. ?All medicines you are taking, including vitamins, herbs, eye drops, creams, and over-the-counter medicines. ?Any problems you or your family members have had with anesthetic medicines. ?Any surgeries you have had. ?Any blood disorders you have. ?Any medical conditions you have. ?What are the risks? ?Generally, this is a safe procedure. However, problems may occur, including: ?Failed induction. ?Changes in fetal heart rate, such as being too high, too low, or irregular (erratic). ?Infection in the mother or the baby. ?Increased risk of having a cesarean delivery. ?Breaking off  (abruption) of the placenta from the uterus. This is rare. ?Rupture of the uterus. This is very rare. ?Your baby could fail to get enough blood flow or oxygen. This can be life-threatening. ?When induction is needed for medical reasons, the benefits generally outweigh the risks. ?What happens during the procedure? ?During the procedure, your health care provider will use one of these methods to induce labor: ?Stripping the membranes. In this method, the amniotic sac tissue is gently separated from the cervix. This causes the following to happen: ?Your cervix stretches, which in turn causes the release of prostaglandins. ?Prostaglandins induce labor and cause the uterus to contract. ?This procedure is often done in an office visit. You will be sent home to wait for contractions to begin. ?Prostaglandin medicine. This medicine starts contractions and causes the cervix to dilate and ripen. This can be taken by mouth (orally) or by being inserted into the vagina (suppository). ?Inserting a small, thin tube (catheter) with a balloon into the vagina and then expanding the balloon with water to dilate the cervix. ?Breaking the water. In this method, a small instrument is used to make a small hole in the amniotic sac. This eventually causes the amniotic sac to break. Contractions should begin within a few hours. ?Medicine to trigger or strengthen contractions. This medicine is given through an IV that is inserted into a vein in your arm. ?This procedure may vary among health care providers and hospitals. ?Where to find more information ?March of D[employer]: www.marchofdimes.org ?The American College of Obstetricians and Gynecologists: www.acog.org ?Summary ?Labor induction causes a pregnant woman's uterus to contract. It also causes the cervix   to soften (ripen), open (dilate), and thin out. ?Labor is usually not induced before 39 weeks of pregnancy unless there is a medical reason to do so. ?When induction is needed for medical  reasons, the benefits generally outweigh the risks. ?Talk with your health care provider about which methods of labor induction are right for you. ?This information is not intended to replace advice given to you by your health care provider. Make sure you discuss any questions you have with your health care provider. ?Document Revised: 09/19/2020 Document Reviewed: 09/19/2020 ?Elsevier Patient Education ? 2022 Elsevier Inc. ? ?

## 2021-11-19 ENCOUNTER — Inpatient Hospital Stay: Payer: Managed Care, Other (non HMO) | Admitting: Anesthesiology

## 2021-11-19 ENCOUNTER — Encounter: Payer: Self-pay | Admitting: Certified Nurse Midwife

## 2021-11-19 ENCOUNTER — Inpatient Hospital Stay
Admission: EM | Admit: 2021-11-19 | Discharge: 2021-11-21 | DRG: 807 | Disposition: A | Discharge status: Home / Self Care | Payer: Managed Care, Other (non HMO) | Attending: Certified Nurse Midwife | Admitting: Certified Nurse Midwife

## 2021-11-19 ENCOUNTER — Other Ambulatory Visit: Payer: Self-pay

## 2021-11-19 DIAGNOSIS — R000 Tachycardia, unspecified: Secondary | ICD-10-CM | POA: Diagnosis not present

## 2021-11-19 DIAGNOSIS — Z3A41 41 weeks gestation of pregnancy: Secondary | ICD-10-CM

## 2021-11-19 DIAGNOSIS — L409 Psoriasis, unspecified: Secondary | ICD-10-CM | POA: Diagnosis not present

## 2021-11-19 DIAGNOSIS — Z20822 Contact with and (suspected) exposure to covid-19: Secondary | ICD-10-CM | POA: Diagnosis present

## 2021-11-19 DIAGNOSIS — O480 Post-term pregnancy: Principal | ICD-10-CM | POA: Diagnosis present

## 2021-11-19 DIAGNOSIS — O9972 Diseases of the skin and subcutaneous tissue complicating childbirth: Secondary | ICD-10-CM | POA: Diagnosis not present

## 2021-11-19 DIAGNOSIS — O99613 Diseases of the digestive system complicating pregnancy, third trimester: Secondary | ICD-10-CM

## 2021-11-19 DIAGNOSIS — O26893 Other specified pregnancy related conditions, third trimester: Secondary | ICD-10-CM

## 2021-11-19 DIAGNOSIS — O99893 Other specified diseases and conditions complicating puerperium: Secondary | ICD-10-CM | POA: Diagnosis not present

## 2021-11-19 DIAGNOSIS — R339 Retention of urine, unspecified: Secondary | ICD-10-CM | POA: Diagnosis not present

## 2021-11-19 DIAGNOSIS — O9089 Other complications of the puerperium, not elsewhere classified: Secondary | ICD-10-CM | POA: Diagnosis not present

## 2021-11-19 DIAGNOSIS — K589 Irritable bowel syndrome without diarrhea: Secondary | ICD-10-CM

## 2021-11-19 LAB — TYPE AND SCREEN
ABO/RH(D): O POS
Antibody Screen: NEGATIVE

## 2021-11-19 LAB — RPR: RPR Ser Ql: NONREACTIVE

## 2021-11-19 LAB — HEPATITIS C ANTIBODY: HCV Ab: NONREACTIVE

## 2021-11-19 LAB — CBC
HCT: 33 % — ABNORMAL LOW (ref 36.0–46.0)
Hemoglobin: 11.2 g/dL — ABNORMAL LOW (ref 12.0–15.0)
MCH: 30.4 pg (ref 26.0–34.0)
MCHC: 33.9 g/dL (ref 30.0–36.0)
MCV: 89.4 fL (ref 80.0–100.0)
Platelets: 269 10*3/uL (ref 150–400)
RBC: 3.69 MIL/uL — ABNORMAL LOW (ref 3.87–5.11)
RDW: 15.8 % — ABNORMAL HIGH (ref 11.5–15.5)
WBC: 12.4 10*3/uL — ABNORMAL HIGH (ref 4.0–10.5)
nRBC: 0 % (ref 0.0–0.2)

## 2021-11-19 LAB — RESP PANEL BY RT-PCR (FLU A&B, COVID) ARPGX2
Influenza A by PCR: NEGATIVE
Influenza B by PCR: NEGATIVE
SARS Coronavirus 2 by RT PCR: NEGATIVE

## 2021-11-19 LAB — SARS CORONAVIRUS 2 (TAT 6-24 HRS): SARS Coronavirus 2: NEGATIVE

## 2021-11-19 LAB — ABO/RH: ABO/RH(D): O POS

## 2021-11-19 MED ORDER — MISOPROSTOL 50MCG HALF TABLET
50.0000 ug | ORAL_TABLET | ORAL | Status: DC
  Administered 2021-11-19 (×2): 50 ug via VAGINAL
  Filled 2021-11-19: qty 1

## 2021-11-19 MED ORDER — DIPHENHYDRAMINE HCL 50 MG/ML IJ SOLN: 12.5000 mg | INTRAMUSCULAR | Status: DC | PRN

## 2021-11-19 MED ORDER — METHYLERGONOVINE MALEATE 0.2 MG PO TABS
0.2000 mg | ORAL_TABLET | ORAL | Status: DC | PRN
  Filled 2021-11-19: qty 1

## 2021-11-19 MED ORDER — FENTANYL-BUPIVACAINE-NACL 0.5-0.125-0.9 MG/250ML-% EP SOLN
12.0000 mL/h | EPIDURAL | Status: DC | PRN
  Administered 2021-11-19: 12 mL/h via EPIDURAL

## 2021-11-19 MED ORDER — OXYCODONE-ACETAMINOPHEN 5-325 MG PO TABS: 2.0000 | ORAL_TABLET | ORAL | Status: DC | PRN

## 2021-11-19 MED ORDER — BUPIVACAINE HCL (PF) 0.25 % IJ SOLN
INTRAMUSCULAR | Status: DC | PRN
  Administered 2021-11-19 (×2): 3 mL via EPIDURAL

## 2021-11-19 MED ORDER — BUTORPHANOL TARTRATE 1 MG/ML IJ SOLN: 1.0000 mg | INTRAMUSCULAR | Status: DC | PRN

## 2021-11-19 MED ORDER — OXYTOCIN-SODIUM CHLORIDE 30-0.9 UT/500ML-% IV SOLN
2.5000 [IU]/h | INTRAVENOUS | Status: DC
  Filled 2021-11-19: qty 1000

## 2021-11-19 MED ORDER — LIDOCAINE-EPINEPHRINE (PF) 1.5 %-1:200000 IJ SOLN
INTRAMUSCULAR | Status: DC | PRN
  Administered 2021-11-19: 3 mL via PERINEURAL

## 2021-11-19 MED ORDER — SENNOSIDES-DOCUSATE SODIUM 8.6-50 MG PO TABS
2.0000 | ORAL_TABLET | ORAL | Status: DC
  Administered 2021-11-20: 2 via ORAL
  Filled 2021-11-19: qty 2

## 2021-11-19 MED ORDER — ACETAMINOPHEN 325 MG PO TABS
650.0000 mg | ORAL_TABLET | ORAL | Status: DC | PRN
  Administered 2021-11-19 – 2021-11-20 (×2): 650 mg via ORAL
  Filled 2021-11-19 (×2): qty 2

## 2021-11-19 MED ORDER — PHENYLEPHRINE 40 MCG/ML (10ML) SYRINGE FOR IV PUSH (FOR BLOOD PRESSURE SUPPORT)
80.0000 ug | PREFILLED_SYRINGE | INTRAVENOUS | Status: DC | PRN
  Filled 2021-11-19: qty 10

## 2021-11-19 MED ORDER — METOPROLOL TARTRATE 50 MG PO TABS
25.0000 mg | ORAL_TABLET | Freq: Four times a day (QID) | ORAL | Status: DC | PRN
  Filled 2021-11-19: qty 0.5

## 2021-11-19 MED ORDER — WITCH HAZEL-GLYCERIN EX PADS
1.0000 "application " | MEDICATED_PAD | CUTANEOUS | Status: DC | PRN
  Filled 2021-11-19: qty 100

## 2021-11-19 MED ORDER — TERBUTALINE SULFATE 1 MG/ML IJ SOLN: 0.2500 mg | Freq: Once | INTRAMUSCULAR | Status: DC | PRN

## 2021-11-19 MED ORDER — FENTANYL-BUPIVACAINE-NACL 0.5-0.125-0.9 MG/250ML-% EP SOLN
EPIDURAL | Status: AC
  Filled 2021-11-19: qty 250

## 2021-11-19 MED ORDER — DOCUSATE SODIUM 100 MG PO CAPS
100.0000 mg | ORAL_CAPSULE | Freq: Two times a day (BID) | ORAL | Status: DC
  Administered 2021-11-20 – 2021-11-21 (×3): 100 mg via ORAL
  Filled 2021-11-19 (×3): qty 1

## 2021-11-19 MED ORDER — LACTATED RINGERS IV SOLN: INTRAVENOUS | Status: DC

## 2021-11-19 MED ORDER — IBUPROFEN 600 MG PO TABS: 600.0000 mg | ORAL_TABLET | Freq: Four times a day (QID) | ORAL | Status: DC

## 2021-11-19 MED ORDER — LIDOCAINE HCL (PF) 1 % IJ SOLN
INTRAMUSCULAR | Status: DC | PRN
  Administered 2021-11-19: 2 mL

## 2021-11-19 MED ORDER — WITCH HAZEL-GLYCERIN EX PADS
MEDICATED_PAD | CUTANEOUS | Status: AC
  Filled 2021-11-19: qty 100

## 2021-11-19 MED ORDER — EPHEDRINE 5 MG/ML INJ
10.0000 mg | INTRAVENOUS | Status: DC | PRN
  Filled 2021-11-19: qty 2

## 2021-11-19 MED ORDER — MISOPROSTOL 200 MCG PO TABS
800.0000 ug | ORAL_TABLET | Freq: Once | ORAL | Status: AC
  Administered 2021-11-19: 800 ug via RECTAL

## 2021-11-19 MED ORDER — LACTATED RINGERS IV SOLN
500.0000 mL | Freq: Once | INTRAVENOUS | Status: AC
  Administered 2021-11-19: 500 mL via INTRAVENOUS

## 2021-11-19 MED ORDER — OXYTOCIN BOLUS FROM INFUSION
333.0000 mL | Freq: Once | INTRAVENOUS | Status: AC
  Administered 2021-11-19: 333 mL via INTRAVENOUS

## 2021-11-19 MED ORDER — BENZOCAINE-MENTHOL 20-0.5 % EX AERO
1.0000 "application " | INHALATION_SPRAY | CUTANEOUS | Status: DC | PRN
  Filled 2021-11-19: qty 56

## 2021-11-19 MED ORDER — OXYCODONE-ACETAMINOPHEN 5-325 MG PO TABS: 1.0000 | ORAL_TABLET | ORAL | Status: DC | PRN

## 2021-11-19 MED ORDER — LACTATED RINGERS IV SOLN: 500.0000 mL | INTRAVENOUS | Status: DC | PRN

## 2021-11-19 MED ORDER — LIDOCAINE HCL (PF) 1 % IJ SOLN: 30.0000 mL | INTRAMUSCULAR | Status: DC | PRN

## 2021-11-19 MED ORDER — FERROUS SULFATE 325 (65 FE) MG PO TABS
325.0000 mg | ORAL_TABLET | Freq: Every day | ORAL | Status: DC
  Administered 2021-11-20 – 2021-11-21 (×2): 325 mg via ORAL
  Filled 2021-11-19 (×2): qty 1

## 2021-11-19 MED ORDER — BENZOCAINE-MENTHOL 20-0.5 % EX AERO
INHALATION_SPRAY | CUTANEOUS | Status: AC
  Filled 2021-11-19: qty 56

## 2021-11-19 MED ORDER — DIBUCAINE (PERIANAL) 1 % EX OINT
1.0000 "application " | TOPICAL_OINTMENT | CUTANEOUS | Status: DC | PRN
  Filled 2021-11-19 (×2): qty 28

## 2021-11-19 MED ORDER — SIMETHICONE 80 MG PO CHEW: 80.0000 mg | CHEWABLE_TABLET | ORAL | Status: DC | PRN

## 2021-11-19 MED ORDER — PRENATAL MULTIVITAMIN CH
1.0000 | ORAL_TABLET | Freq: Every day | ORAL | Status: DC
  Administered 2021-11-20 – 2021-11-21 (×2): 1 via ORAL
  Filled 2021-11-19 (×2): qty 1

## 2021-11-19 MED ORDER — COCONUT OIL OIL
1.0000 "application " | TOPICAL_OIL | Status: DC | PRN
  Filled 2021-11-19 (×2): qty 120

## 2021-11-19 MED ORDER — OXYTOCIN-SODIUM CHLORIDE 30-0.9 UT/500ML-% IV SOLN
1.0000 m[IU]/min | INTRAVENOUS | Status: DC
  Administered 2021-11-19: 2 m[IU]/min via INTRAVENOUS

## 2021-11-19 MED ORDER — METHYLERGONOVINE MALEATE 0.2 MG/ML IJ SOLN: 0.2000 mg | INTRAMUSCULAR | Status: DC | PRN

## 2021-11-19 NOTE — Progress Notes (Signed)
LABOR NOTE   Megan Mack 30 y.o.GP@ at [redacted]w[redacted]d  SUBJECTIVE:  Comfortable with epidural  Analgesia: Epidural  OBJECTIVE:  BP (!) 97/40   Pulse 70   Temp 98.3 F (36.8 C) (Oral)   Resp 18   Ht 5' (1.524 m)   Wt 60.9 kg   LMP 02/04/2021   SpO2 100%   BMI 26.23 kg/m  No intake/output data recorded.  She has shown cervical change. CERVIX:per rn exam SVE:   Dilation: 7.5 Effacement (%): 80 Station: 0 Exam by:: B. Gean Quint RN CONTRACTIONS: regular, every 2-3 minutes FHR: Fetal heart tracing reviewed. Baseline: 130 bpm, Variability: Good {> 6 bpm), Accelerations: Reactive, and Decelerations: Early Category I    Labs: Lab Results  Component Value Date   WBC 12.4 (H) 11/19/2021   HGB 11.2 (L) 11/19/2021   HCT 33.0 (L) 11/19/2021   MCV 89.4 11/19/2021   PLT 269 11/19/2021    ASSESSMENT: 1) Labor curve reviewed.       Progress: Active phase labor.     Membranes: ruptured, clear fluid           Principal Problem:   Labor and delivery, indication for care   PLAN: continue present management   Doreene Burke, CNM 11/19/2021 5:12 PM

## 2021-11-19 NOTE — Progress Notes (Signed)
Vacuum assisted vaginal delivery. 2059 vacuum applied pulled with no pop offs for 4 minutes until delivery at 2103. Vacuum removed at time of delivery.

## 2021-11-19 NOTE — H&P (Signed)
History and Physical   HPI  Megan Mack is a 30 y.o. G1P0000 at [redacted]w[redacted]d Estimated Date of Delivery: 11/11/21 who is being admitted for induction of labor due to post dates.   OB History  OB History  Gravida Para Term Preterm AB Living  1 0 0 0 0 0  SAB IAB Ectopic Multiple Live Births  0 0 0 0 0    # Outcome Date GA Lbr Len/2nd Weight Sex Delivery Anes PTL Lv  1 Current             PROBLEM LIST  Pregnancy complications or risks: Patient Active Problem List   Diagnosis Date Noted   Labor and delivery, indication for care 11/01/2021   IBS (irritable bowel syndrome) 05/05/2021   Psoriasis 05/05/2021   Inappropriate sinus tachycardia 09/21/2018    Prenatal labs and studies: ABO, Rh: --/--/O POS Performed at [facility], [address] [place], [place] [address]  [phone]) Antibody: NEG (11/30 0040) Rubella: <0.90 (04/29 1126) RPR: Non Reactive (08/31 1022)  HBsAg:    HIV: Non Reactive (04/29 1126)  [id number] (10/25 1009)   Past Medical History:  Diagnosis Date   Colitis    Inappropriate sinus tachycardia    PCOS (polycystic ovarian syndrome)      Past Surgical History:  Procedure Laterality Date   NASAL SINUS SURGERY     TONSILLECTOMY     WISDOM TOOTH EXTRACTION       Medications    Current Discharge Medication List     CONTINUE these medications which have NOT CHANGED   Details  calcium gluconate 500 MG tablet Take 1 tablet by mouth 3 (three) times daily.    Iron-FA-B Cmp-C-Biot-Probiotic (FUSION PLUS) CAPS Take 1 tablet by mouth daily. Qty: 30 capsule, Refills: 9    magnesium 30 MG tablet Take 30 mg by mouth 2 (two) times daily.    metoprolol tartrate (LOPRESSOR) 25 MG tablet Take by mouth.    Prenatal Vit-Fe Fumarate-FA (MULTIVITAMIN-PRENATAL) 27-0.8 MG TABS tablet Take 1 tablet by mouth daily at 12 noon.   Associated Diagnoses: Prenatal care in first trimester         Allergies  Ondansetron  hcl  Review of Systems  Constitutional: negative Eyes: negative Ears, nose, mouth, throat, and face: negative Respiratory: negative Cardiovascular: negative Gastrointestinal: negative Genitourinary:negative Integument/breast: negative Hematologic/lymphatic: negative Musculoskeletal:negative Neurological: negative Behavioral/Psych: negative  Physical Exam  BP (!) 103/59 (BP Location: Left Arm)   Pulse 95   Temp 98.2 F (36.8 C) (Oral)   Resp 16   Ht 5' (1.524 m)   Wt 60.9 kg   LMP 02/04/2021   BMI 26.23 kg/m   Lungs:  CTA B Cardio: RRR without M/R/G Abd: Soft, gravid, NT Presentation: cephalic EXT: No C/C/ 1+ Edema DTRs: 2+ B CERVIX: Dilation: 2.5 Effacement (%): 50, 60 Cervical Position: Posterior Station: -3 Exam by:: A Cecille Rubin RN  See Prenatal records for more detailed PE.     FHR:  Baseline: 130 bpm, Variability: Good {> 6 bpm), Accelerations: Reactive, and Decelerations: Absent  Toco: Uterine Contractions: Frequency: Every 2-3 minutes and Intensity: mild  Test Results  Results for orders placed or performed during the hospital encounter of 11/19/21 (from the past 24 hour(s))  Type and screen     Status: None   Collection Time: 11/19/21 12:40 AM  Result Value Ref Range   ABO/RH(D) O POS    Antibody Screen NEG    Sample Expiration  11/22/2021,2359 Performed at [facility], [address] [place], [place] [address]   CBC     Status: Abnormal   Collection Time: 11/19/21 12:40 AM  Result Value Ref Range   WBC 12.4 (H) 4.0 - 10.5 K/uL   RBC 3.69 (L) 3.87 - 5.11 MIL/uL   Hemoglobin 11.2 (L) 12.0 - 15.0 g/dL   HCT 19.1 (L) 47.8 - 29.5 %   MCV 89.4 80.0 - 100.0 fL   MCH 30.4 26.0 - 34.0 pg   MCHC 33.9 30.0 - 36.0 g/dL   RDW 62.1 (H) 30.8 - 65.7 %   Platelets 269 150 - 400 K/uL   nRBC 0.0 0.0 - 0.2 %  Resp Panel by RT-PCR (Flu A&B, Covid) Nasopharyngeal Swab     Status: None   Collection Time: 11/19/21 12:40 AM   Specimen:  Nasopharyngeal Swab; Nasopharyngeal(NP) swabs in vial transport medium  Result Value Ref Range   SARS Coronavirus 2 by RT PCR NEGATIVE NEGATIVE   Influenza A by PCR NEGATIVE NEGATIVE   Influenza B by PCR NEGATIVE NEGATIVE  ABO/Rh     Status: None   Collection Time: 11/19/21  1:46 AM  Result Value Ref Range   ABO/RH(D)      O POS Performed at [facility], [address] [place], [place] [address]    Group B Strep negative  Assessment   G1P0000 at [redacted]w[redacted]d Estimated Date of Delivery: 11/11/21  The fetus is reassuring.   Patient Active Problem List   Diagnosis Date Noted   Labor and delivery, indication for care 11/01/2021   IBS (irritable bowel syndrome) 05/05/2021   Psoriasis 05/05/2021   Inappropriate sinus tachycardia 09/21/2018    Plan  1. Admit to L&D :   2. EFM:-- Category 1 3. Stadol or Epidural if desired.   4. Admission labs  5. Dr.Evans aware of pt 6. Anticipate NSVD  Doreene Burke, CNM  11/19/2021 7:23 AM

## 2021-11-19 NOTE — Anesthesia Preprocedure Evaluation (Addendum)
Anesthesia Evaluation  Patient identified by MRN, date of birth, ID band Patient awake    Reviewed: Allergy & Precautions, H&P , NPO status , Patient's Chart, lab work & pertinent test results  Airway Mallampati: II       Dental  (+) Teeth Intact   Pulmonary neg pulmonary ROS,    breath sounds clear to auscultation       Cardiovascular negative cardio ROS   Rhythm:Regular Rate:Normal     Neuro/Psych negative neurological ROS  negative psych ROS   GI/Hepatic negative GI ROS, Neg liver ROS,   Endo/Other  negative endocrine ROS  Renal/GU negative Renal ROS  negative genitourinary   Musculoskeletal   Abdominal   Peds  Hematology negative hematology ROS (+)   Anesthesia Other Findings   Reproductive/Obstetrics (+) Pregnancy                            Anesthesia Physical Anesthesia Plan  ASA: 1  Anesthesia Plan: Epidural   Post-op Pain Management:    Induction:   PONV Risk Score and Plan:   Airway Management Planned:   Additional Equipment:   Intra-op Plan:   Post-operative Plan:   Informed Consent: I have reviewed the patients History and Physical, chart, labs and discussed the procedure including the risks, benefits and alternatives for the proposed anesthesia with the patient or authorized representative who has indicated his/her understanding and acceptance.       Plan Discussed with: Anesthesiologist and CRNA  Anesthesia Plan Comments:         Anesthesia Quick Evaluation

## 2021-11-19 NOTE — Progress Notes (Signed)
LABOR NOTE   Megan Mack 29 y.o.GP@ at [redacted]w[redacted]d  SUBJECTIVE:  Denies urge to push or any pressure. Very numb from epidural.  Analgesia: Labor support without medications  OBJECTIVE:  BP (!) 96/48 (BP Location: Right Arm)   Pulse 92   Temp 98.6 F (37 C) (Oral)   Resp 16   Ht 5' (1.524 m)   Wt 60.9 kg   LMP 02/04/2021   SpO2 100%   BMI 26.23 kg/m  No intake/output data recorded.  She has shown cervical change. CERVIX: 10 cm :  100%:   +1:   not felt:    SVE:   Dilation: 10 Effacement (%): 100 Station: Plus 1 Exam by:: A Kyrra Prada CNM CONTRACTIONS: regular, every 2-3 minutesBaseline: 120 bpm, Variability: Good {> 6 bpm), Accelerations: Reactive, and Decelerations: Early FHR: Fetal heart tracing reviewed.  Category I    Labs: Lab Results  Component Value Date   WBC 12.4 (H) 11/19/2021   HGB 11.2 (L) 11/19/2021   HCT 33.0 (L) 11/19/2021   MCV 89.4 11/19/2021   PLT 269 11/19/2021    ASSESSMENT: 1) Labor curve reviewed.       Progress: Active phase labor.     Membranes: intact, ruptured, clear fluid           Principal Problem:   Labor and delivery, indication for care Post dates   PLAN: continue present management Epidural rate decreased to assist with effect pushing. Will wait 20-30 min then start pushing. Pt and her partner agree with plan.   Doreene Burke, CNM  11/19/2021

## 2021-11-19 NOTE — Anesthesia Procedure Notes (Addendum)
Epidural Patient location during procedure: OB Start time: 11/19/2021 2:50 PM End time: 11/19/2021 2:55 PM  Staffing Anesthesiologist: Corinda Gubler, MD Resident/CRNA: Hezzie Bump, CRNA Performed: resident/CRNA   Preanesthetic Checklist Completed: patient identified, IV checked, site marked, risks and benefits discussed, surgical consent, monitors and equipment checked, pre-op evaluation and timeout performed  Epidural Patient position: sitting Prep: ChloraPrep Patient monitoring: heart rate, continuous pulse ox and blood pressure Approach: midline Location: L3-L4 Injection technique: LOR saline  Needle:  Needle type: Tuohy  Needle gauge: 17 G Needle length: 9 cm and 9 Needle insertion depth: 5 cm Catheter type: closed end flexible Catheter size: 19 Gauge Catheter at skin depth: 11 cm Test dose: negative and 1.5% lidocaine with Epi 1:200 K  Assessment Sensory level: T10 Events: blood not aspirated, injection not painful, no injection resistance, no paresthesia and negative IV test  Additional Notes 1 attempt Pt. Evaluated and documentation done after procedure finished. Patient identified. Risks/Benefits/Options discussed with patient including but not limited to bleeding, infection, nerve damage, paralysis, failed block, incomplete pain control, headache, blood pressure changes, nausea, vomiting, reactions to medication both or allergic, itching and postpartum back pain. Confirmed with bedside nurse the patient's most recent platelet count. Confirmed with patient that they are not currently taking any anticoagulation, have any bleeding history or any family history of bleeding disorders. Patient expressed understanding and wished to proceed. All questions were answered. Sterile technique was used throughout the entire procedure. Please see nursing notes for vital signs. Test dose was given through epidural catheter and negative prior to continuing to dose epidural or start  infusion. Warning signs of high block given to the patient including shortness of breath, tingling/numbness in hands, complete motor block, or any concerning symptoms with instructions to call for help. Patient was given instructions on fall risk and not to get out of bed. All questions and concerns addressed with instructions to call with any issues or inadequate analgesia.    Patient tolerated the insertion well without immediate complications.Reason for block:procedure for pain

## 2021-11-19 NOTE — Progress Notes (Signed)
LABOR NOTE   Allison Quarry 30 y.o.GP@ at [redacted]w[redacted]d  SUBJECTIVE:  Feeling cramping  Analgesia: Labor support without medications  OBJECTIVE:  BP 105/63   Pulse 72   Temp 98.3 F (36.8 C) (Oral)   Resp 18   Ht 5' (1.524 m)   Wt 60.9 kg   LMP 02/04/2021   BMI 26.23 kg/m  No intake/output data recorded.  She has shown cervical change. CERVIX: 3 cm:  70%:   -2:   posterior:   firm SVE:   Dilation: 3 Effacement (%): 70 Station: -3 Exam by:: Janee Morn CNM CONTRACTIONS: regular, every 2-4 minutes FHR: Fetal heart tracing reviewed. Baseline: 120 bpm, Variability: Good {> 6 bpm), Accelerations: Reactive, and Decelerations: Absent Category I    Labs: Lab Results  Component Value Date   WBC 12.4 (H) 11/19/2021   HGB 11.2 (L) 11/19/2021   HCT 33.0 (L) 11/19/2021   MCV 89.4 11/19/2021   PLT 269 11/19/2021    ASSESSMENT: 1) Labor curve reviewed.       Progress: Early latent labor.     Membranes: intact           Principal Problem:   Labor and delivery, indication for care Post dates  PLAN: IV Pitocin induction   Doreene Burke, CNM  11/19/2021 12:47 PM

## 2021-11-20 ENCOUNTER — Encounter: Payer: Self-pay | Admitting: Obstetrics and Gynecology

## 2021-11-20 LAB — CBC
HCT: 28.2 % — ABNORMAL LOW (ref 36.0–46.0)
Hemoglobin: 9.6 g/dL — ABNORMAL LOW (ref 12.0–15.0)
MCH: 30.2 pg (ref 26.0–34.0)
MCHC: 34 g/dL (ref 30.0–36.0)
MCV: 88.7 fL (ref 80.0–100.0)
Platelets: 212 10*3/uL (ref 150–400)
RBC: 3.18 MIL/uL — ABNORMAL LOW (ref 3.87–5.11)
RDW: 15.9 % — ABNORMAL HIGH (ref 11.5–15.5)
WBC: 21.3 10*3/uL — ABNORMAL HIGH (ref 4.0–10.5)
nRBC: 0 % (ref 0.0–0.2)

## 2021-11-20 LAB — HEPATITIS B SURFACE ANTIGEN: Hepatitis B Surface Ag: NONREACTIVE

## 2021-11-20 LAB — HEPATITIS B SURFACE ANTIBODY, QUANTITATIVE: Hep B S AB Quant (Post): <3.1 m[IU]/mL — ABNORMAL LOW (ref >9.9)

## 2021-11-20 MED ORDER — ACETAMINOPHEN 325 MG PO TABS
650.0000 mg | ORAL_TABLET | Freq: Four times a day (QID) | ORAL | Status: DC | PRN
  Administered 2021-11-20 – 2021-11-21 (×4): 650 mg via ORAL
  Filled 2021-11-20 (×5): qty 2

## 2021-11-20 NOTE — Lactation Note (Signed)
This note was copied from a baby's chart. Lactation Consultation Note  Patient Name: Megan Mack [id number] Date: 11/20/2021 Reason for consult: Follow-up assessment;RN request Age:29 hours  Lactation follow-up. 8hrs since the last feeding, baby has been sleepy and uninterested. Upon entry dad was changing a poop diaper and baby peed while being changed. Baby was awake and alert, LC suggested attempted feeding. Dad brought baby to mom, mom initially started in cradle hold on R breast. LC pointed out baby's position and alignment, and the need for more head/neck support. Dad was involved and provided the support for what would be a cross cradle hold. With the extra support baby latched and sustained latch, audible swallows were heard and audible swallows were pointed out to the parents. Praised the parents for their team work.  We discussed transient nipple tenderness, coconut oil use to help. Tips given for keeping baby awake throughout the feeding to ensure a full feeding is complete. Signs of adequate transfer with baby's body language changing, or pulling/pushing away from the breast. Encouraged continued feeding efforts overnight tonight. Provided anticipatory guidance for cluster feeding and importance of putting baby to breast with all cues.  Maternal Data Has patient been taught Hand Expression?: Yes Does the patient have breastfeeding experience prior to this delivery?: No  Feeding Mother's Current Feeding Choice: Breast Milk  LATCH Score Latch: Grasps breast easily, tongue down, lips flanged, rhythmical sucking.  Audible Swallowing: Spontaneous and intermittent  Type of Nipple: Everted at rest and after stimulation  Comfort (Breast/Nipple): Soft / non-tender  Hold (Positioning): Assistance needed to correctly position infant at breast and maintain latch.  LATCH Score: 9   Lactation Tools Discussed/Used    Interventions Interventions: Breast feeding basics  reviewed;Assisted with latch;Education;Support pillows  Discharge    Consult Status Consult Status: Follow-up Date: 11/21/21 Follow-up type: In-patient    Danford Bad 11/20/2021, 3:45 PM

## 2021-11-20 NOTE — Progress Notes (Signed)
Notify Maddy RN bp was obtained x 2 with similar value.

## 2021-11-20 NOTE — Lactation Note (Signed)
This note was copied from a baby's chart. Lactation Consultation Note  Patient Name: Megan Mack [id number]'[id number] Date: 11/20/2021 Reason for consult: Initial assessment;Primapara;Term Age:29 hours  Initial lactation visit. Mom is P1, SVD 15hours ago to infant born at [redacted]w[redacted]d. Feedings have been documented since delivery as well as emesis after the last feeding this morning. Baby has remained spitty throughout the morning.  Baby was skin to skin on mom in cradle hold upon entry, mom reports just trying to feed her. Baby is awake but calm and non-cuing. LC attempted with mom's support; baby did not latch.  Parents educated on the impact of an infant being spitty can have on baby's desire to eat, the fullness of amniotic fluid and baby processing that. Provided reassurance and encourage to continue frequent feeding efforts aiming for 8-12x in 24 hours w/ option of spoon feeding/hand expression if needed. Encouraged to keep baby skin to skin and upright throughout today.  Reviewed BF basics: newborn feeding patterns and behaviors, early feeding cues, feeding on demand, output expectations, and demonstrated hand expression.  Whiteboard updated w/ LC name/number; encouraged to call for assistance at next attempt.  Maternal Data Has patient been taught Hand Expression?: Yes Does the patient have breastfeeding experience prior to this delivery?: No  Feeding Mother's Current Feeding Choice: Breast Milk  LATCH Score                    Lactation Tools Discussed/Used    Interventions Interventions: Breast feeding basics reviewed;Hand express;Education  Discharge    Consult Status Consult Status: Follow-up Date: 11/20/21 Follow-up type: In-patient    Danford Bad 11/20/2021, 12:17 PM

## 2021-11-20 NOTE — Anesthesia Postprocedure Evaluation (Signed)
Anesthesia Post Note  Patient: Megan Mack  Procedure(s) Performed: AN AD HOC LABOR EPIDURAL  Patient location during evaluation: Mother Baby Anesthesia Type: Epidural Level of consciousness: oriented and awake and alert Pain management: pain level controlled Vital Signs Assessment: post-procedure vital signs reviewed and stable Respiratory status: spontaneous breathing and respiratory function stable Cardiovascular status: blood pressure returned to baseline and stable Postop Assessment: no headache, no backache, no apparent nausea or vomiting and able to ambulate Anesthetic complications: no   No notable events documented.   Last Vitals:  Vitals:   11/20/21 0744 11/20/21 0810  BP: (!) 85/54 99/69  Pulse: 99 85  Resp: 18   Temp: 36.9 C   SpO2: 99%     Last Pain:  Vitals:   11/20/21 0744  TempSrc: Oral  PainSc:                  Starling Manns

## 2021-11-20 NOTE — Progress Notes (Signed)
Progress Note - Vaginal Delivery  Megan Mack is a 30 y.o. G1P1001 now PP day 1 s/p Vaginal, Vacuum (Extractor) .   Subjective:  The patient reports no complaints, up ad lib, tolerating PO, and + flatus. She has not been able to completely empty her bladder completely.Uterus is deviated to right side. Bladder scan showed retained urine.    Objective:  Vital signs in last 24 hours: Temp:  [98.3 F (36.8 C)-99 F (37.2 C)] 98.4 F (36.9 C) (12/01 0744) Pulse Rate:  [66-106] 85 (12/01 0810) Resp:  [16-18] 18 (12/01 0744) BP: (85-124)/(40-96) 99/69 (12/01 0810) SpO2:  [96 %-100 %] 99 % (12/01 0744)  Physical Exam:  General: alert, cooperative, appears stated age, fatigued, and no distress Lochia: appropriate Uterine Fundus: firm Significant perineal & vaginal swelling-ice applied    Data Review Recent Labs    11/19/21 0040 11/20/21 0842  HGB 11.2* 9.6*  HCT 33.0* 28.2*    Assessment/Plan: Principal Problem:   Labor and delivery, indication for care    Foley catheter placed, will keep in  for a minimum of 12 hrs. Ice applied. Will potentially remove in 12 hrs if swelling has improved. Reviewed with pt and her partner. She verbalizes and agree.    -- Continue routine PP care.    Doreene Burke, CNM  11/20/2021 9:29 AM

## 2021-11-21 MED ORDER — DOCUSATE SODIUM 100 MG PO CAPS
100.0000 mg | ORAL_CAPSULE | Freq: Two times a day (BID) | ORAL | 0 refills | Status: DC
Start: 2021-11-21 — End: 2022-10-01

## 2021-11-21 NOTE — Progress Notes (Signed)
Error

## 2021-11-21 NOTE — Final Progress Note (Signed)
Discharge Day SOAP Note:  Progress Note - Vaginal Delivery  Megan Mack is a 30 y.o. G1P1001 now PP day 2 s/p Vaginal, Vacuum (Extractor) . Delivery was uncomplicated  Subjective  The patient has the following complaints: has no unusual complaints  Pain is controlled with current medications.   Patient is urinating without difficulty.  She is ambulating well.     Objective  Vital signs: BP 96/62 (BP Location: Right Arm)   Pulse 97   Temp 98.2 F (36.8 C) (Oral)   Resp 18   Ht 5' (1.524 m)   Wt 60.9 kg   LMP 02/04/2021   SpO2 97%   Breastfeeding Unknown   BMI 26.23 kg/m   Physical Exam: Gen: NAD Fundus Fundal Tone: Firm  Lochia Amount: Scant        Data Review Labs: Lab Results  Component Value Date   WBC 21.3 (H) 11/20/2021   HGB 9.6 (L) 11/20/2021   HCT 28.2 (L) 11/20/2021   MCV 88.7 11/20/2021   PLT 212 11/20/2021   CBC Latest Ref Rng & Units 11/20/2021 11/19/2021 08/20/2021  WBC 4.0 - 10.5 K/uL 21.3(H) 12.4(H) 10.2  Hemoglobin 12.0 - 15.0 g/dL 4.7(Q) 11.2(L) 10.2(L)  Hematocrit 36.0 - 46.0 % 28.2(L) 33.0(L) 31.4(L)  Platelets 150 - 400 K/uL 212 269 257   O POS Performed at [facility], [address] [place], [place] [address]   Inocente Salles Score: Inocente Salles Postnatal Depression Scale Screening Tool 11/21/2021  I have been able to laugh and see the funny side of things. 0  I have looked forward with enjoyment to things. 0  I have blamed myself unnecessarily when things went wrong. 1  I have been anxious or worried for no good reason. 0  I have felt scared or panicky for no good reason. 0  Things have been getting on top of me. 0  I have been so unhappy that I have had difficulty sleeping. 0  I have felt sad or miserable. 0  I have been so unhappy that I have been crying. 0  The thought of harming myself has occurred to me. 0  Edinburgh Postnatal Depression Scale Total 1    Assessment/Plan  Principal Problem:   Labor and  delivery, indication for care   =Vacuum assisted Vaginal delivery   Foley removed this morning, pt now voiding without difficulty x 2 , bladder scan indicates emptying.   Plan for discharge today.  Discharge Instructions: Per After Visit Summary. Activity: Advance as tolerated. Pelvic rest for 6 weeks.  Also refer to After Visit Summary Diet: Regular Medications: Allergies as of 11/21/2021       Reactions   Ondansetron Hcl Nausea And Vomiting   N/V, GI upset        Medication List     TAKE these medications    calcium gluconate 500 MG tablet Take 1 tablet by mouth 3 (three) times daily.   docusate sodium 100 MG capsule Commonly known as: COLACE Take 1 capsule (100 mg total) by mouth 2 (two) times daily.   Fusion Plus Caps Take 1 tablet by mouth daily.   magnesium 30 MG tablet Take 30 mg by mouth 2 (two) times daily.   metoprolol tartrate 25 MG tablet Commonly known as: LOPRESSOR Take by mouth.   multivitamin-prenatal 27-0.8 MG Tabs tablet Take 1 tablet by mouth daily at 12 noon.       Outpatient follow up: 2 wk video visit, 6 wk ppv with Pattricia Boss  Postpartum contraception: Condoms  Discharged Condition: good  Discharged to: home  Newborn Data: Disposition:home with mother  Apgars: APGAR (1 MIN): 9   APGAR (5 MINS): 10   APGAR (10 MINS):    Baby Feeding: Breast  Philip Aspen, CNM  11/21/2021 1:25 PM

## 2021-11-21 NOTE — Progress Notes (Signed)
Patient discharged with infant. Discharge instructions, prescriptions, and follow up appointments given to and reviewed with patient. Patient verbalized understanding. Will be escorted out by axillary.  °

## 2021-11-21 NOTE — Discharge Summary (Signed)
Patient Name: Berlin Viereck DOB: 1991/06/11 MRN: [id number]                            Discharge Summary  Date of Admission: 11/19/2021 Date of Discharge: 11/21/2021 Delivering Provider: Doreene Burke   Admitting Diagnosis: Labor and delivery, indication for care [O75.9] at [redacted]w[redacted]d Secondary diagnosis:  Principal Problem:   Labor and delivery, indication for care  Mode of Delivery: vacuum-assisted vaginal delivery              Discharge diagnosis: Term Pregnancy Delivered      Intrapartum Procedures: epidural   Post partum procedures:  Foley x 24 hr for urinary retention  Complications:  2 degree perineal laceration, right sulcus                      Discharge Day SOAP Note:  Progress Note - Vaginal Delivery  Megan Mack is a 30 y.o. G1P1001 now PP day 2 s/p Vaginal, Vacuum (Extractor) . Delivery was uncomplicated  Subjective  The patient has the following complaints: has no unusual complaints  Pain is controlled with current medications.   Patient is urinating without difficulty.  She is ambulating well.     Objective  Vital signs: BP 96/62 (BP Location: Right Arm)   Pulse 97   Temp 98.2 F (36.8 C) (Oral)   Resp 18   Ht 5' (1.524 m)   Wt 60.9 kg   LMP 02/04/2021   SpO2 97%   Breastfeeding Unknown   BMI 26.23 kg/m   Physical Exam: Gen: NAD Fundus Fundal Tone: Firm  Lochia Amount: Scant        Data Review Labs: Lab Results  Component Value Date   WBC 21.3 (H) 11/20/2021   HGB 9.6 (L) 11/20/2021   HCT 28.2 (L) 11/20/2021   MCV 88.7 11/20/2021   PLT 212 11/20/2021   CBC Latest Ref Rng & Units 11/20/2021 11/19/2021 08/20/2021  WBC 4.0 - 10.5 K/uL 21.3(H) 12.4(H) 10.2  Hemoglobin 12.0 - 15.0 g/dL 0.2(D) 11.2(L) 10.2(L)  Hematocrit 36.0 - 46.0 % 28.2(L) 33.0(L) 31.4(L)  Platelets 150 - 400 K/uL 212 269 257   O POS Performed at [facility], [address] [place], [place] [address]   Inocente Salles Score: Inocente Salles  Postnatal Depression Scale Screening Tool 11/21/2021  I have been able to laugh and see the funny side of things. 0  I have looked forward with enjoyment to things. 0  I have blamed myself unnecessarily when things went wrong. 1  I have been anxious or worried for no good reason. 0  I have felt scared or panicky for no good reason. 0  Things have been getting on top of me. 0  I have been so unhappy that I have had difficulty sleeping. 0  I have felt sad or miserable. 0  I have been so unhappy that I have been crying. 0  The thought of harming myself has occurred to me. 0  Edinburgh Postnatal Depression Scale Total 1    Assessment/Plan  Principal Problem:   Labor and delivery, indication for care   =Vacuum assisted Vaginal delivery   Foley removed this morning, pt now voiding without difficulty x 2 , bladder scan indicates emptying.   Plan for discharge today.  Discharge Instructions: Per After Visit Summary. Activity: Advance as tolerated. Pelvic rest for 6 weeks.  Also refer to After Visit Summary  Diet: Regular Medications: Allergies as of 11/21/2021       Reactions   Ondansetron Hcl Nausea And Vomiting   N/V, GI upset        Medication List     TAKE these medications    calcium gluconate 500 MG tablet Take 1 tablet by mouth 3 (three) times daily.   docusate sodium 100 MG capsule Commonly known as: COLACE Take 1 capsule (100 mg total) by mouth 2 (two) times daily.   Fusion Plus Caps Take 1 tablet by mouth daily.   magnesium 30 MG tablet Take 30 mg by mouth 2 (two) times daily.   metoprolol tartrate 25 MG tablet Commonly known as: LOPRESSOR Take by mouth.   multivitamin-prenatal 27-0.8 MG Tabs tablet Take 1 tablet by mouth daily at 12 noon.       Outpatient follow up: 2 wk video visit, 6 wk ppv with Deneise Lever  Postpartum contraception: Condoms  Discharged Condition: good  Discharged to: home  Newborn Data: Disposition:home with mother  Apgars:  APGAR (1 MIN): 9   APGAR (5 MINS): 10   APGAR (10 MINS):    Baby Feeding: Breast  Philip Aspen, CNM  11/21/2021 1:25 PM

## 2021-11-21 NOTE — Progress Notes (Signed)
Foley removed at 8am per request of Doreene Burke. Pt ambulatory, no complaints, asymptomatic.

## 2021-11-21 NOTE — Lactation Note (Addendum)
This note was copied from a baby's chart. Lactation Consultation Note  Patient Name: Megan Mack [id number] Date: 11/21/2021 Reason for consult: Follow-up assessment;Primapara;Term Age:29 hours  Maternal Data Has patient been taught Hand Expression?: Yes Does the patient have breastfeeding experience prior to this delivery?: No  Feeding Mother's Current Feeding Choice: Breast Milk Mom having difficulty latching baby to left breast, placed in left side lying position with baby beside her, shown how to shape breast to assist baby with latching , baby latched easily and is nursing well with swallows noted  LATCH Score Latch: Grasps breast easily, tongue down, lips flanged, rhythmical sucking.  Audible Swallowing: Spontaneous and intermittent  Type of Nipple: Everted at rest and after stimulation  Comfort (Breast/Nipple): Soft / non-tender  Hold (Positioning): Assistance needed to correctly position infant at breast and maintain latch.  LATCH Score: 9   Lactation Tools Discussed/Used  LC name and no written on white board    Interventions Interventions: Assisted with latch;Skin to skin;Hand express;Adjust position;Expressed milk;Education  Discharge Pump: Personal WIC Program: No  Consult Status Consult Status: PRN Date: 11/21/21 Follow-up type: In-patient    Dyann Kief 11/21/2021, 1:08 PM

## 2021-12-01 ENCOUNTER — Encounter: Payer: Self-pay | Admitting: Certified Nurse Midwife

## 2021-12-09 ENCOUNTER — Encounter: Payer: Self-pay | Admitting: Certified Nurse Midwife

## 2021-12-09 ENCOUNTER — Other Ambulatory Visit: Payer: Self-pay

## 2021-12-09 ENCOUNTER — Ambulatory Visit (INDEPENDENT_AMBULATORY_CARE_PROVIDER_SITE_OTHER): Payer: Managed Care, Other (non HMO) | Admitting: Certified Nurse Midwife

## 2021-12-09 VITALS — BP 112/77 | HR 79 | Ht 60.0 in | Wt 117.5 lb

## 2021-12-09 DIAGNOSIS — Z1331 Encounter for screening for depression: Secondary | ICD-10-CM

## 2021-12-09 DIAGNOSIS — O9089 Other complications of the puerperium, not elsewhere classified: Secondary | ICD-10-CM

## 2021-12-09 NOTE — Patient Instructions (Signed)
Postpartum Baby Blues The postpartum period begins right after the birth of a baby. During this time, there is often joy and excitement. It is also a time of many changes in the life of the parents. A mother may feel happy one minute and sad or stressed the next. These feelings of sadness, called the baby blues, usually happen in the period right after the baby is born and go away within a week or two. What are the causes? The exact cause of this condition is not known. Changes in hormone levels after childbirth are believed to trigger some of the symptoms. Other factors that can play a role in these mood changes include: Lack of sleep. Stressful life events, such as financial problems, caring for a loved one, or death of a loved one. Genetics. What are the signs or symptoms? Symptoms of this condition include: Changes in mood, such as going from extreme happiness to sadness. A decrease in concentration. Difficulty sleeping. Crying spells and tearfulness. Loss of appetite. Irritability. Anxiety. If these symptoms last for more than 2 weeks or become more severe, you may have postpartum depression. How is this diagnosed? This condition is diagnosed based on an evaluation of your symptoms. Your health care provider may use a screening tool that includes a list of questions to help identify a person with the baby blues or postpartum depression. How is this treated? The baby blues usually go away on their own in 1-2 weeks. Social support is often what is needed. You will be encouraged to get adequate sleep and rest. Follow these instructions at home: Lifestyle   Get as much rest as you can. Take a nap when the baby sleeps. Exercise regularly as told by your health care provider. Some women find yoga and walking to be helpful. Eat a balanced and nourishing diet. This includes plenty of fruits and vegetables, whole grains, and lean proteins. Do little things that you enjoy. Take a bubble bath,  read your favorite magazine, or listen to your favorite music. Avoid alcohol. Ask for help with household chores, cooking, grocery shopping, or running errands. Do not try to do everything yourself. Consider hiring a postpartum doula to help. This is a professional who specializes in providing support to new mothers. Try not to make any major life changes during pregnancy or right after giving birth. This can add stress. General instructions Talk to people close to you about how you are feeling. Get support from your partner, family members, friends, or other new moms. You may want to join a support group. Find ways to manage stress. This may include: Writing your thoughts and feelings in a journal. Spending time outside. Spending time with people who make you laugh. Try to stay positive in how you think. Think about the things you are grateful for. Take over-the-counter and prescription medicines only as told by your health care provider. Let your health care provider know if you have any concerns. Keep all postpartum visits. This is important. Contact a health care provider if: Your baby blues do not go away after 2 weeks. Get help right away if: You have thoughts of taking your own life (suicidal thoughts), or of harming your baby or someone else. You see or hear things that are not there (hallucinations). If you ever feel like you may hurt yourself or others, or have thoughts about taking your own life, get help right away. Go to your nearest emergency department or: Call your local emergency services (911 in the  [place] Call a suicide crisis helpline, such as the National Suicide Prevention Lifeline, at (352)699-6574 or 988 in the [place] This is open 24 hours a day in the [place] Text the Crisis Text Line at 303-292-4980 (in the [place] Summary After giving birth, you may feel happy one minute and sad or stressed the next. Feelings of sadness that happen right after the baby is born and go away  after a week or two are called the baby blues. You can manage the baby blues by getting enough rest, eating a healthy diet, exercising, spending time with supportive people, and finding ways to manage stress. If feelings of sadness and stress last longer than 2 weeks or get in the way of caring for your baby, talk with your health care provider. This may mean you have postpartum depression. This information is not intended to replace advice given to you by your health care provider. Make sure you discuss any questions you have with your health care provider. Document Revised: 07/02/2021 Document Reviewed: 05/31/2020 Elsevier Patient Education  2022 [employer].

## 2021-12-09 NOTE — Progress Notes (Signed)
HPI: Pt is 2 wk pp came to office with concern for bleeding at site of stitches. She states that she looked at her bottom and could not tell and just wanted to get it checked out  On exam perineal well approximated with some stitches starting to disolve, no signs of bleeding or infection. Right sulcus tear difficult to to see , stiches felt, no active bleeding note. Normal exam , small amount of vaginal bleeding noted.   Plan: reviewed normal postpartum bleeding and healing, Discussed self help measures. She verbalizes and agrees.   She is doing well with her mood. States that she is exclusively pumping, which is going well. She denies any other concerns.

## 2021-12-21 ENCOUNTER — Encounter: Payer: Self-pay | Admitting: Certified Nurse Midwife

## 2021-12-21 HISTORY — PX: COLONOSCOPY W/ ENDOSCOPIC US: SHX1379

## 2021-12-31 ENCOUNTER — Ambulatory Visit (INDEPENDENT_AMBULATORY_CARE_PROVIDER_SITE_OTHER): Payer: Managed Care, Other (non HMO) | Admitting: Certified Nurse Midwife

## 2021-12-31 ENCOUNTER — Encounter: Payer: Self-pay | Admitting: Certified Nurse Midwife

## 2021-12-31 ENCOUNTER — Other Ambulatory Visit: Payer: Self-pay

## 2021-12-31 NOTE — Progress Notes (Signed)
Subjective:    Megan Mack is a 31 y.o. G28P1001 Caucasian female who presents for a postpartum visit. She is 6 weeks postpartum following a spontaneous vaginal delivery with vacuum  at 41.1 gestational weeks. Anesthesia: epidural. I have fully reviewed the prenatal and intrapartum course. Postpartum course has been normal. Baby's course has been normal. Baby is feeding by breast. Bleeding no bleeding. Bowel function is normal. Bladder function is normal. Patient is not sexually active.  Contraception method is condoms. Postpartum depression screening: negative. Score 6.  Last pap 05/05/21 and was negative.  The following portions of the patient's history were reviewed and updated as appropriate: allergies, current medications, past medical history, past surgical history and problem list.  Review of Systems Pertinent items are noted in HPI.   Vitals:   12/31/21 1040  BP: 114/77  Pulse: 82  Weight: 112 lb 14.4 oz (51.2 kg)  Height: 5' (1.524 m)   No LMP recorded (lmp unknown).  Objective:   General:  alert, cooperative and no distress   Breasts:  deferred, no complaints  Lungs: clear to auscultation bilaterally  Heart:  regular rate and rhythm  Abdomen: soft, nontender   Vulva: normal  Vagina: normal vagina  Cervix:  closed  Corpus: Well-involuted  Adnexa:  Non-palpable  Rectal Exam: no hemorrhoids        Assessment:   Postpartum exam 6 wks s/p VD vacuum Breast/Pumping feeding Depression screening Contraception counseling   Plan:  : condoms Follow up in: 6 months for annual or earlier if needed  Doreene Burke, CNM

## 2021-12-31 NOTE — Patient Instructions (Signed)
Kegel Exercises ?Kegel exercises can help strengthen your pelvic floor muscles. The pelvic floor is a group of muscles that support your rectum, small intestine, and bladder. In females, pelvic floor muscles also help support the uterus. These muscles help you control the flow of urine and stool (feces). ?Kegel exercises are painless and simple. They do not require any equipment. Your provider may suggest Kegel exercises to: ?Improve bladder and bowel control. ?Improve sexual response. ?Improve weak pelvic floor muscles after surgery to remove the uterus (hysterectomy) or after pregnancy, in females. ?Improve weak pelvic floor muscles after prostate gland removal or surgery, in males. ?Kegel exercises involve squeezing your pelvic floor muscles. These are the same muscles you squeeze when you try to stop the flow of urine or keep from passing gas. The exercises can be done while sitting, standing, or lying down, but it is best to vary your position. ?Ask your health care provider which exercises are safe for you. Do exercises exactly as told by your health care provider and adjust them as directed. Do not begin these exercises until told by your health care provider. ?Exercises ?How to do Kegel exercises: ?Squeeze your pelvic floor muscles tight. You should feel a tight lift in your rectal area. If you are a female, you should also feel a tightness in your vaginal area. Keep your stomach, buttocks, and legs relaxed. ?Hold the muscles tight for up to 10 seconds. ?Breathe normally. ?Relax your muscles for up to 10 seconds. ?Repeat as told by your health care provider. ?Repeat this exercise daily as told by your health care provider. Continue to do this exercise for at least 4-6 weeks, or for as long as told by your health care provider. ?You may be referred to a physical therapist who can help you learn more about how to do Kegel exercises. ?Depending on your condition, your health care provider may  recommend: ?Varying how long you squeeze your muscles. ?Doing several sets of exercises every day. ?Doing exercises for several weeks. ?Making Kegel exercises a part of your regular exercise routine. ?This information is not intended to replace advice given to you by your health care provider. Make sure you discuss any questions you have with your health care provider. ?Document Revised: 04/17/2021 Document Reviewed: 04/17/2021 ?Elsevier Patient Education ? 2022 Elsevier Inc. ? ? ?Preventive Care 21-39 Years Old, Female ?Preventive care refers to lifestyle choices and visits with your health care provider that can promote health and wellness. Preventive care visits are also called wellness exams. ?What can I expect for my preventive care visit? ?Counseling ?During your preventive care visit, your health care provider may ask about your: ?Medical history, including: ?Past medical problems. ?Family medical history. ?Pregnancy history. ?Current health, including: ?Menstrual cycle. ?Method of birth control. ?Emotional well-being. ?Home life and relationship well-being. ?Sexual activity and sexual health. ?Lifestyle, including: ?Alcohol, nicotine or tobacco, and drug use. ?Access to firearms. ?Diet, exercise, and sleep habits. ?Work and work environment. ?Sunscreen use. ?Safety issues such as seatbelt and bike helmet use. ?Physical exam ?Your health care provider may check your: ?Height and weight. These may be used to calculate your BMI (body mass index). BMI is a measurement that tells if you are at a healthy weight. ?Waist circumference. This measures the distance around your waistline. This measurement also tells if you are at a healthy weight and may help predict your risk of certain diseases, such as type 2 diabetes and high blood pressure. ?Heart rate and blood pressure. ?Body temperature. ?  Skin for abnormal spots. ?What immunizations do I need? ?Vaccines are usually given at various ages, according to a schedule.  Your health care provider will recommend vaccines for you based on your age, medical history, and lifestyle or other factors, such as travel or where you work. ?What tests do I need? ?Screening ?Your health care provider may recommend screening tests for certain conditions. This may include: ?Pelvic exam and Pap test. ?Lipid and cholesterol levels. ?Diabetes screening. This is done by checking your blood sugar (glucose) after you have not eaten for a while (fasting). ?Hepatitis B test. ?Hepatitis C test. ?HIV (human immunodeficiency virus) test. ?STI (sexually transmitted infection) testing, if you are at risk. ?BRCA-related cancer screening. This may be done if you have a family history of breast, ovarian, tubal, or peritoneal cancers. ?Talk with your health care provider about your test results, treatment options, and if necessary, the need for more tests. ?Follow these instructions at home: ?Eating and drinking ? ?Eat a healthy diet that includes fresh fruits and vegetables, whole grains, lean protein, and low-fat dairy products. ?Take vitamin and mineral supplements as recommended by your health care provider. ?Do not drink alcohol if: ?Your health care provider tells you not to drink. ?You are pregnant, may be pregnant, or are planning to become pregnant. ?If you drink alcohol: ?Limit how much you have to 0-1 drink a day. ?Know how much alcohol is in your drink. In the [place] one drink equals one 12 oz bottle of beer (355 mL), one 5 oz glass of wine (148 mL), or one 1? oz glass of hard liquor (44 mL). ?Lifestyle ?Brush your teeth every morning and night with fluoride toothpaste. Floss one time each day. ?Exercise for at least 30 minutes 5 or more days each week. ?Do not use any products that contain nicotine or tobacco. These products include cigarettes, chewing tobacco, and vaping devices, such as e-cigarettes. If you need help quitting, ask your health care provider. ?Do not use drugs. ?If you are sexually  active, practice safe sex. Use a condom or other form of protection to prevent STIs. ?If you do not wish to become pregnant, use a form of birth control. If you plan to become pregnant, see your health care provider for a prepregnancy visit. ?Find healthy ways to manage stress, such as: ?Meditation, yoga, or listening to music. ?Journaling. ?Talking to a trusted person. ?Spending time with friends and family. ?Minimize exposure to UV radiation to reduce your risk of skin cancer. ?Safety ?Always wear your seat belt while driving or riding in a vehicle. ?Do not drive: ?If you have been drinking alcohol. Do not ride with someone who has been drinking. ?If you have been using any mind-altering substances or drugs. ?While texting. ?When you are tired or distracted. ?Wear a helmet and other protective equipment during sports activities. ?If you have firearms in your house, make sure you follow all gun safety procedures. ?Seek help if you have been physically or sexually abused. ?What's next? ?Go to your health care provider once a year for an annual wellness visit. ?Ask your health care provider how often you should have your eyes and teeth checked. ?Stay up to date on all vaccines. ?This information is not intended to replace advice given to you by your health care provider. Make sure you discuss any questions you have with your health care provider. ?Document Revised: 06/04/2021 Document Reviewed: 06/04/2021 ?Elsevier Patient Education ? 2022 Elsevier Inc. ? ?

## 2022-06-08 ENCOUNTER — Encounter: Payer: Self-pay | Admitting: Certified Nurse Midwife

## 2022-09-25 ENCOUNTER — Encounter: Payer: Self-pay | Admitting: Certified Nurse Midwife

## 2022-10-01 ENCOUNTER — Ambulatory Visit (INDEPENDENT_AMBULATORY_CARE_PROVIDER_SITE_OTHER): Payer: Managed Care, Other (non HMO)

## 2022-10-01 ENCOUNTER — Encounter: Payer: Self-pay | Admitting: Certified Nurse Midwife

## 2022-10-01 VITALS — BP 113/64 | HR 76 | Resp 16 | Ht 60.0 in | Wt 109.5 lb

## 2022-10-01 DIAGNOSIS — Z3201 Encounter for pregnancy test, result positive: Secondary | ICD-10-CM | POA: Diagnosis not present

## 2022-10-01 DIAGNOSIS — Z113 Encounter for screening for infections with a predominantly sexual mode of transmission: Secondary | ICD-10-CM

## 2022-10-01 DIAGNOSIS — Z0283 Encounter for blood-alcohol and blood-drug test: Secondary | ICD-10-CM

## 2022-10-01 DIAGNOSIS — T7589XA Other specified effects of external causes, initial encounter: Secondary | ICD-10-CM

## 2022-10-01 DIAGNOSIS — Z3481 Encounter for supervision of other normal pregnancy, first trimester: Secondary | ICD-10-CM

## 2022-10-01 DIAGNOSIS — Z975 Presence of (intrauterine) contraceptive device: Secondary | ICD-10-CM

## 2022-10-01 DIAGNOSIS — N912 Amenorrhea, unspecified: Secondary | ICD-10-CM

## 2022-10-01 LAB — POCT URINE PREGNANCY: Preg Test, Ur: POSITIVE — AB

## 2022-10-01 NOTE — Progress Notes (Signed)
Patient presents for nurse visit pregnancy confirmation. She believes she could be pregnant. Pregnancy is desire. Pregnancy was planned. Sexual Activity:  female/married    Contraception: IUD. Current symptoms also include: 1 Positive home pregnancy test, nausea and lightheadedness. Last menstrual period is unknown, but it was normal.  NOB urine test collected during visit.

## 2022-10-01 NOTE — Patient Instructions (Signed)
Common Medications Safe in Pregnancy  Acne:      Constipation:  Benzoyl Peroxide     Colace  Clindamycin      Dulcolax Suppository  Topica Erythromycin     Fibercon  Salicylic Acid      Metamucil         Miralax AVOID:        Senakot   Accutane    Cough:  Retin-A       Cough Drops  Tetracycline      Phenergan w/ Codeine if Rx  Minocycline      Robitussin (Plain & DM)  Antibiotics:     Crabs/Lice:  Ceclor       RID  Cephalosporins    AVOID:  E-Mycins      Kwell  Keflex  Macrobid/Macrodantin   Diarrhea:  Penicillin      Kao-Pectate  Zithromax      Imodium AD         PUSH FLUIDS AVOID:       Cipro     Fever:  Tetracycline      Tylenol (Regular or Extra  Minocycline       Strength)  Levaquin      Extra Strength-Do not          Exceed 8 tabs/24 hrs Caffeine:        <200mg/day (equiv. To 1 cup of coffee or  approx. 3 12 oz sodas)         Gas: Cold/Hayfever:       Gas-X  Benadryl      Mylicon  Claritin       Phazyme  **Claritin-D        Chlor-Trimeton    Headaches:  Dimetapp      ASA-Free Excedrin  Drixoral-Non-Drowsy     Cold Compress  Mucinex (Guaifenasin)     Tylenol (Regular or Extra  Sudafed/Sudafed-12 Hour     Strength)  **Sudafed PE Pseudoephedrine   Tylenol Cold & Sinus     Vicks Vapor Rub  Zyrtec  **AVOID if Problems With Blood Pressure         Heartburn: Avoid lying down for at least 1 hour after meals  Aciphex      Maalox     Rash:  Milk of Magnesia     Benadryl    Mylanta       1% Hydrocortisone Cream  Pepcid  Pepcid Complete   Sleep Aids:  Prevacid      Ambien   Prilosec       Benadryl  Rolaids       Chamomile Tea  Tums (Limit 4/day)     Unisom         Tylenol PM         Warm milk-add vanilla or  Hemorrhoids:       Sugar for taste  Anusol/Anusol H.C.  (RX: Analapram 2.5%)  Sugar Substitutes:  Hydrocortisone OTC     Ok in moderation  Preparation H      Tucks        Vaseline lotion applied to tissue with  wiping    Herpes:     Throat:  Acyclovir      Oragel  Famvir  Valtrex     Vaccines:         Flu Shot Leg Cramps:       *Gardasil  Benadryl      Hepatitis A         Hepatitis B Nasal Spray:         Pneumovax  Saline Nasal Spray     Polio Booster         Tetanus Nausea:       Tuberculosis test or PPD  Vitamin B6 25 mg TID   AVOID:    Dramamine      *Gardasil  Emetrol       Live Poliovirus  Ginger Root 250 mg QID    MMR (measles, mumps &  High Complex Carbs @ Bedtime    rebella)  Sea Bands-Accupressure    Varicella (Chickenpox)  Unisom 1/2 tab TID     *No known complications           If received before Pain:         Known pregnancy;   Darvocet       Resume series after  Lortab        Delivery  Percocet    Yeast:   Tramadol      Femstat  Tylenol 3      Gyne-lotrimin  Ultram       Monistat  Vicodin           MISC:         All Sunscreens           Hair Coloring/highlights          Insect Repellant's          (Including DEET)         Mystic Tans First Trimester of Pregnancy  The first trimester of pregnancy starts on the first day of your last menstrual period until the end of week 12. This is also called months 1 through 3 of pregnancy. Body changes during your first trimester Your body goes through many changes during pregnancy. The changes usually return to normal after your baby is born. Physical changes You may gain or lose weight. Your breasts may grow larger and hurt. The area around your nipples may get darker. Dark spots or blotches may develop on your face. You may have changes in your hair. Health changes You may feel like you might vomit (nauseous), and you may vomit. You may have heartburn. You may have headaches. You may have trouble pooping (constipation). Your gums may bleed. Other changes You may get tired easily. You may pee (urinate) more often. Your menstrual periods will stop. You may not feel hungry. You may want to eat certain kinds of food. You  may have changes in your emotions from day to day. You may have more dreams. Follow these instructions at home: Medicines Take over-the-counter and prescription medicines only as told by your doctor. Some medicines are not safe during pregnancy. Take a prenatal vitamin that contains at least 600 micrograms (mcg) of folic acid. Eating and drinking Eat healthy meals that include: Fresh fruits and vegetables. Whole grains. Good sources of protein, such as meat, eggs, or tofu. Low-fat dairy products. Avoid raw meat and unpasteurized juice, milk, and cheese. If you feel like you may vomit, or you vomit: Eat 4 or 5 small meals a day instead of 3 large meals. Try eating a few soda crackers. Drink liquids between meals instead of during meals. You may need to take these actions to prevent or treat trouble pooping: Drink enough fluids to keep your pee (urine) pale yellow. Eat foods that are high in fiber. These include beans, whole grains, and fresh fruits and vegetables. Limit foods that are high in fat and sugar. These include fried or sweet foods. Activity Exercise only as  told by your doctor. Most people can do their usual exercise routine during pregnancy. Stop exercising if you have cramps or pain in your lower belly (abdomen) or low back. Do not exercise if it is too hot or too humid, or if you are in a place of great height (high altitude). Avoid heavy lifting. If you choose to, you may have sex unless your doctor tells you not to. Relieving pain and discomfort Wear a good support bra if your breasts are sore. Rest with your legs raised (elevated) if you have leg cramps or low back pain. If you have bulging veins (varicose veins) in your legs: Wear support hose as told by your doctor. Raise your feet for 15 minutes, 3-4 times a day. Limit salt in your food. Safety Wear your seat belt at all times when you are in a car. Talk with your doctor if someone is hurting you or yelling at  you. Talk with your doctor if you are feeling sad or have thoughts of hurting yourself. Lifestyle Do not use hot tubs, steam rooms, or saunas. Do not douche. Do not use tampons or scented sanitary pads. Do not use herbal medicines, illegal drugs, or medicines that are not approved by your doctor. Do not drink alcohol. Do not smoke or use any products that contain nicotine or tobacco. If you need help quitting, ask your doctor. Avoid cat litter boxes and soil that is used by cats. These carry germs that can cause harm to the baby and can cause a loss of your baby by miscarriage or stillbirth. General instructions Keep all follow-up visits. This is important. Ask for help if you need counseling or if you need help with nutrition. Your doctor can give you advice or tell you where to go for help. Visit your dentist. At home, brush your teeth with a soft toothbrush. Floss gently. Write down your questions. Take them to your prenatal visits. Where to find more information American Pregnancy Association: americanpregnancy.org [employer]: www.acog.org Office on Women's Health: KeywordPortfolios.com.br Contact a doctor if: You are dizzy. You have a fever. You have mild cramps or pressure in your lower belly. You have a nagging pain in your belly area. You continue to feel like you may vomit, you vomit, or you have watery poop (diarrhea) for 24 hours or longer. You have a bad-smelling fluid coming from your vagina. You have pain when you pee. You are exposed to a disease that spreads from person to person, such as chickenpox, measles, Zika virus, HIV, or hepatitis. Get help right away if: You have spotting or bleeding from your vagina. You have very bad belly cramping or pain. You have shortness of breath or chest pain. You have any kind of injury, such as from a fall or a car crash. You have new or increased pain, swelling, or redness in an arm or  leg. Summary The first trimester of pregnancy starts on the first day of your last menstrual period until the end of week 12 (months 1 through 3). Eat 4 or 5 small meals a day instead of 3 large meals. Do not smoke or use any products that contain nicotine or tobacco. If you need help quitting, ask your doctor. Keep all follow-up visits. This information is not intended to replace advice given to you by your health care provider. Make sure you discuss any questions you have with your health care provider. Document Revised: 05/15/2020 Document Reviewed: 03/21/2020 Elsevier Patient Education  [place].     Commonly Asked Questions During Pregnancy  Cats: A parasite can be excreted in cat feces.  To avoid exposure you need to have another person empty the little box.  If you must empty the litter box you will need to wear gloves.  Wash your hands after handling your cat.  This parasite can also be found in raw or undercooked meat so this should also be avoided.  Colds, Sore Throats, Flu: Please check your medication sheet to see what you can take for symptoms.  If your symptoms are unrelieved by these medications please call the office.  Dental Work: Most any dental work Investment banker, corporate recommends is permitted.  X-rays should only be taken during the first trimester if absolutely necessary.  Your abdomen should be shielded with a lead apron during all x-rays.  Please notify your provider prior to receiving any x-rays.  Novocaine is fine; gas is not recommended.  If your dentist requires a note from [place] prior to dental work please call the office and we will provide one for you.  Exercise: Exercise is an important part of staying healthy during your pregnancy.  You may continue most exercises you were accustomed to prior to pregnancy.  Later in your pregnancy you will most likely notice you have difficulty with activities requiring balance like riding a bicycle.  It is important that you  listen to your body and avoid activities that put you at a higher risk of falling.  Adequate rest and staying well hydrated are a must!  If you have questions about the safety of specific activities ask your provider.    Exposure to Children with illness: Try to avoid obvious exposure; report any symptoms to [place] when noted,  If you have chicken pos, red measles or mumps, you should be immune to these diseases.   Please do not take any vaccines while pregnant unless you have checked with your OB provider.  Fetal Movement: After 28 weeks we recommend you do "kick counts" twice daily.  Lie or sit down in a calm quiet environment and count your baby movements "kicks".  You should feel your baby at least 10 times per hour.  If you have not felt 10 kicks within the first hour get up, walk around and have something sweet to eat or drink then repeat for an additional hour.  If count remains less than 10 per hour notify your provider.  Fumigating: Follow your pest control agent's advice as to how long to stay out of your home.  Ventilate the area well before re-entering.  Hemorrhoids:   Most over-the-counter preparations can be used during pregnancy.  Check your medication to see what is safe to use.  It is important to use a stool softener or fiber in your diet and to drink lots of liquids.  If hemorrhoids seem to be getting worse please call the office.   Hot Tubs:  Hot tubs Jacuzzis and saunas are not recommended while pregnant.  These increase your internal body temperature and should be avoided.  Intercourse:  Sexual intercourse is safe during pregnancy as long as you are comfortable, unless otherwise advised by your provider.  Spotting may occur after intercourse; report any bright red bleeding that is heavier than spotting.  Labor:  If you know that you are in labor, please go to the hospital.  If you are unsure, please call the office and let us help you decide what to do.  Lifting, straining,  etc:  If  your job requires heavy lifting or straining please check with your provider for any limitations.  Generally, you should not lift items heavier than that you can lift simply with your hands and arms (no back muscles)  Painting:  Paint fumes do not harm your pregnancy, but may make you ill and should be avoided if possible.  Latex or water based paints have less odor than oils.  Use adequate ventilation while painting.  Permanents & Hair Color:  Chemicals in hair dyes are not recommended as they cause increase hair dryness which can increase hair loss during pregnancy.  " Highlighting" and permanents are allowed.  Dye may be absorbed differently and permanents may not hold as well during pregnancy.  Sunbathing:  Use a sunscreen, as skin burns easily during pregnancy.  Drink plenty of fluids; avoid over heating.  Tanning Beds:  Because their possible side effects are still unknown, tanning beds are not recommended.  Ultrasound Scans:  Routine ultrasounds are performed at approximately 20 weeks.  You will be able to see your baby's general anatomy an if you would like to know the gender this can usually be determined as well.  If it is questionable when you conceived you may also receive an ultrasound early in your pregnancy for dating purposes.  Otherwise ultrasound exams are not routinely performed unless there is a medical necessity.  Although you can request a scan we ask that you pay for it when conducted because insurance does not cover " patient request" scans.  Work: If your pregnancy proceeds without complications you may work until your due date, unless your physician or employer advises otherwise.  Round Ligament Pain/Pelvic Discomfort:  Sharp, shooting pains not associated with bleeding are fairly common, usually occurring in the second trimester of pregnancy.  They tend to be worse when standing up or when you remain standing for long periods of time.  These are the result of pressure of  certain pelvic ligaments called "round ligaments".  Rest, Tylenol and heat seem to be the most effective relief.  As the womb and fetus grow, they rise out of the pelvis and the discomfort improves.  Please notify the office if your pain seems different than that described.  It may represent a more serious condition.

## 2022-10-02 LAB — PAIN MGT SCRN (14 DRUGS), UR
Amphetamine Scrn, Ur: NEGATIVE ng/mL
BARBITURATE SCREEN URINE: NEGATIVE ng/mL
BENZODIAZEPINE SCREEN, URINE: NEGATIVE ng/mL
Buprenorphine, Urine: NEGATIVE ng/mL
CANNABINOIDS UR QL SCN: NEGATIVE ng/mL
Cocaine (Metab) Scrn, Ur: NEGATIVE ng/mL
Creatinine(Crt), U: 188.7 mg/dL (ref 20.0–300.0)
Fentanyl, Urine: NEGATIVE pg/mL
Meperidine Screen, Urine: NEGATIVE ng/mL
Methadone Screen, Urine: NEGATIVE ng/mL
OXYCODONE+OXYMORPHONE UR QL SCN: NEGATIVE ng/mL
Opiate Scrn, Ur: NEGATIVE ng/mL
Ph of Urine: 5.4 (ref 4.5–8.9)
Phencyclidine Qn, Ur: NEGATIVE ng/mL
Propoxyphene Scrn, Ur: NEGATIVE ng/mL
Tramadol Screen, Urine: NEGATIVE ng/mL

## 2022-10-02 LAB — URINALYSIS, ROUTINE W REFLEX MICROSCOPIC
Bilirubin, UA: NEGATIVE
Glucose, UA: NEGATIVE
Ketones, UA: NEGATIVE
Leukocytes,UA: NEGATIVE
Nitrite, UA: NEGATIVE
RBC, UA: NEGATIVE
Specific Gravity, UA: >=1.03 — AB (ref 1.005–1.030)
Urobilinogen, Ur: 0.2 mg/dL (ref 0.2–1.0)
pH, UA: 5.5 (ref 5.0–7.5)

## 2022-10-03 LAB — URINE CULTURE, OB REFLEX

## 2022-10-03 LAB — CULTURE, OB URINE

## 2022-10-04 LAB — GC/CHLAMYDIA PROBE AMP
Chlamydia trachomatis, NAA: NEGATIVE
Neisseria Gonorrhoeae by PCR: NEGATIVE

## 2022-10-06 ENCOUNTER — Encounter: Payer: Self-pay | Admitting: Certified Nurse Midwife

## 2022-10-22 ENCOUNTER — Telehealth: Payer: Self-pay | Admitting: Obstetrics and Gynecology

## 2022-10-22 NOTE — Telephone Encounter (Signed)
Reached out to pt to reschedule 11/7 NOB intake appt to 11/8 at 8:15.  Left message for pt to call back to reschedule.

## 2022-10-23 NOTE — Telephone Encounter (Signed)
I contacted patient via phone. Left message for patient to call back to confirm.

## 2022-10-25 ENCOUNTER — Encounter: Payer: Self-pay | Admitting: Certified Nurse Midwife

## 2022-10-26 ENCOUNTER — Ambulatory Visit: Payer: Self-pay

## 2022-10-27 ENCOUNTER — Ambulatory Visit: Payer: Self-pay

## 2022-10-28 ENCOUNTER — Ambulatory Visit (INDEPENDENT_AMBULATORY_CARE_PROVIDER_SITE_OTHER): Payer: Self-pay

## 2022-10-28 VITALS — Wt 106.0 lb

## 2022-10-28 DIAGNOSIS — Z369 Encounter for antenatal screening, unspecified: Secondary | ICD-10-CM

## 2022-10-28 DIAGNOSIS — Z3689 Encounter for other specified antenatal screening: Secondary | ICD-10-CM

## 2022-10-28 DIAGNOSIS — Z3480 Encounter for supervision of other normal pregnancy, unspecified trimester: Secondary | ICD-10-CM

## 2022-10-28 NOTE — Progress Notes (Signed)
New OB Intake  I connected with  Allison Quarry on 10/28/22 at  8:15 AM EST by telephone and verified that I am speaking with the correct person using two identifiers. Nurse is located at [facility] and pt is located at home.  I explained I am completing New OB Intake today. We discussed her EDD of unknown that is based on LMP of unknown. Pt is G2/P1001. I reviewed her allergies, medications, Medical/Surgical/OB history, and appropriate screenings. Based on history, this is a/an pregnancy uncomplicated .   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  Anatomy US Explained first scheduled [place] will be tomorrow for dating/viability and an anatomy scan will be done at 20 weeks.  Labs Discussed genetic screening with patient. Patient desires genetic testing to be drawn with new OB labs Discussed possible labs to be drawn at new OB appointment.  COVID Vaccine Patient has not had COVID vaccine.   Social Determinants of Health Food Insecurity: denies food insecurity Transportation: Patient denies transportation needs. Childcare: Discussed no children allowed at ultrasound appointments.   First visit review I reviewed new OB appt with pt. I explained she will have ob bloodwork and pap smear/pelvic exam if indicated. Explained pt will be seen by Doreene Burke, CNM at first visit; encounter routed to appropriate provider.   Loran Senters, [facility] 10/28/2022  8:44 AM

## 2022-10-29 ENCOUNTER — Other Ambulatory Visit: Payer: Self-pay | Admitting: Certified Nurse Midwife

## 2022-10-29 ENCOUNTER — Ambulatory Visit (INDEPENDENT_AMBULATORY_CARE_PROVIDER_SITE_OTHER): Payer: Managed Care, Other (non HMO)

## 2022-10-29 DIAGNOSIS — Z3480 Encounter for supervision of other normal pregnancy, unspecified trimester: Secondary | ICD-10-CM

## 2022-10-29 DIAGNOSIS — Z369 Encounter for antenatal screening, unspecified: Secondary | ICD-10-CM

## 2022-10-29 DIAGNOSIS — Z3687 Encounter for antenatal screening for uncertain dates: Secondary | ICD-10-CM | POA: Diagnosis not present

## 2022-11-04 ENCOUNTER — Other Ambulatory Visit: Payer: Self-pay

## 2022-11-09 ENCOUNTER — Other Ambulatory Visit: Payer: Managed Care, Other (non HMO)

## 2022-11-09 DIAGNOSIS — Z369 Encounter for antenatal screening, unspecified: Secondary | ICD-10-CM

## 2022-11-09 DIAGNOSIS — Z3480 Encounter for supervision of other normal pregnancy, unspecified trimester: Secondary | ICD-10-CM

## 2022-11-10 ENCOUNTER — Telehealth: Payer: Self-pay

## 2022-11-10 ENCOUNTER — Other Ambulatory Visit: Payer: Self-pay

## 2022-11-10 ENCOUNTER — Other Ambulatory Visit: Payer: Managed Care, Other (non HMO)

## 2022-11-10 DIAGNOSIS — Z3480 Encounter for supervision of other normal pregnancy, unspecified trimester: Secondary | ICD-10-CM

## 2022-11-10 DIAGNOSIS — Z369 Encounter for antenatal screening, unspecified: Secondary | ICD-10-CM

## 2022-11-10 NOTE — Telephone Encounter (Addendum)
Pt calling; had NOB labs drawn yesterday; has noticed the bill doesn't have the MaterniT 21 on it; pt was told this would be done.  [phone]

## 2022-11-10 NOTE — Telephone Encounter (Signed)
Pt returned call; explained that she was right - it wasn't drawn and it cannot be added to what was drawn b/c it goes to a diff location; adv the good news is that it can be drawn at any time after 10 weeks; she can ask to have it drawn at her NOB exam or at 28wks if she doesn't want to be stuck that much.  Pt requests to go ahead and be scheduled to have it drawn before her exam.  Tx'd to [facility] for scheduling.

## 2022-11-10 NOTE — Telephone Encounter (Signed)
LMTC.  MaterniT 21 cannot be added to what was drawn yesterday per [place].

## 2022-11-11 LAB — CBC/D/PLT+RPR+RH+ABO+RUBIGG...
Antibody Screen: NEGATIVE
Basophils Absolute: 0 10*3/uL (ref 0.0–0.2)
Basos: 0 %
EOS (ABSOLUTE): 0 10*3/uL (ref 0.0–0.4)
Eos: 1 %
HCV Ab: NONREACTIVE
HIV Screen 4th Generation wRfx: NONREACTIVE
Hematocrit: 38.2 % (ref 34.0–46.6)
Hemoglobin: 11.6 g/dL (ref 11.1–15.9)
Hepatitis B Surface Ag: NEGATIVE
Immature Grans (Abs): 0 10*3/uL (ref 0.0–0.1)
Immature Granulocytes: 0 %
Lymphocytes Absolute: 1.9 10*3/uL (ref 0.7–3.1)
Lymphs: 27 %
MCH: 23.7 pg — ABNORMAL LOW (ref 26.6–33.0)
MCHC: 30.4 g/dL — ABNORMAL LOW (ref 31.5–35.7)
MCV: 78 fL — ABNORMAL LOW (ref 79–97)
Monocytes Absolute: 0.5 10*3/uL (ref 0.1–0.9)
Monocytes: 7 %
Neutrophils Absolute: 4.6 10*3/uL (ref 1.4–7.0)
Neutrophils: 65 %
Platelets: 282 10*3/uL (ref 150–450)
RBC: 4.89 x10E6/uL (ref 3.77–5.28)
RDW: 20.9 % — ABNORMAL HIGH (ref 11.7–15.4)
RPR Ser Ql: NONREACTIVE
Rh Factor: POSITIVE
Rubella Antibodies, IGG: 0.9 index — ABNORMAL LOW (ref >0.99)
Varicella zoster IgG: 854 index (ref >165)
WBC: 7 10*3/uL (ref 3.4–10.8)

## 2022-11-11 LAB — HCV INTERPRETATION

## 2022-11-15 LAB — MATERNIT 21 PLUS CORE, BLOOD
Fetal Fraction: 10
Result (T21): NEGATIVE
Trisomy 13 (Patau syndrome): NEGATIVE
Trisomy 18 (Edwards syndrome): NEGATIVE
Trisomy 21 (Down syndrome): NEGATIVE

## 2022-12-02 ENCOUNTER — Encounter: Payer: Self-pay | Admitting: Certified Nurse Midwife

## 2022-12-02 ENCOUNTER — Ambulatory Visit: Payer: Managed Care, Other (non HMO) | Admitting: Certified Nurse Midwife

## 2022-12-02 VITALS — BP 107/71 | HR 90 | Wt 111.3 lb

## 2022-12-02 DIAGNOSIS — Z3481 Encounter for supervision of other normal pregnancy, first trimester: Secondary | ICD-10-CM

## 2022-12-02 DIAGNOSIS — Z3A13 13 weeks gestation of pregnancy: Secondary | ICD-10-CM | POA: Diagnosis not present

## 2022-12-02 LAB — POCT URINALYSIS DIPSTICK OB
Bilirubin, UA: NEGATIVE
Blood, UA: NEGATIVE
Glucose, UA: NEGATIVE
Ketones, UA: NEGATIVE
Leukocytes, UA: NEGATIVE
Nitrite, UA: NEGATIVE
POC,PROTEIN,UA: NEGATIVE
Spec Grav, UA: 1.025
Urobilinogen, UA: 0.2 U/dL
pH, UA: 6.5

## 2022-12-02 NOTE — Progress Notes (Signed)
NEW OB HISTORY AND PHYSICAL  SUBJECTIVE:       Megan Mack is a 31 y.o. G2P1001 female, No LMP recorded (lmp unknown). Patient is pregnant., Estimated Date of Delivery: 06/05/23, [redacted]w[redacted]d, presents today for establishment of Prenatal Care. She has no unusual complaints.    Relationship:Married Living with daughter and husband Work:  Exercise; Walking some  Alcohol/drugs/smoking: denies use    Gynecologic History No LMP recorded (lmp unknown). Patient is pregnant. Normal Contraception: none Last Pap: 05/05/2021. Results were: normal  Obstetric History OB History  Gravida Para Term Preterm AB Living  2 1 1  0 0 1  SAB IAB Ectopic Multiple Live Births  0 0 0 0 1    # Outcome Date GA Lbr Len/2nd Weight Sex Delivery Anes PTL Lv  2 Current           1 Term 11/19/21 [redacted]w[redacted]d 06:35 / 01:49 7 lb 4.1 oz (3.29 kg) F Vag-Vacuum EPI  LIV    Past Medical History:  Diagnosis Date   Colitis    Inappropriate sinus tachycardia    PCOS (polycystic ovarian syndrome)     Past Surgical History:  Procedure Laterality Date   COLONOSCOPY W/ ENDOSCOPIC [redacted]w[redacted]d  2023   NASAL SINUS SURGERY     TONSILLECTOMY     WISDOM TOOTH EXTRACTION      Current Outpatient Medications on File Prior to Visit  Medication Sig Dispense Refill   inFLIXimab (REMICADE IV) Inject 1 Dose into the vein every 8 (eight) weeks.     metoprolol tartrate (LOPRESSOR) 25 MG tablet Take by mouth.     Prenatal Vit-Fe Fumarate-FA (MULTIVITAMIN-PRENATAL) 27-0.8 MG TABS tablet Take 1 tablet by mouth daily at 12 noon.     No current facility-administered medications on file prior to visit.    Allergies  Allergen Reactions   Ondansetron Hcl Nausea And Vomiting    N/V, GI upset    Social History   Socioeconomic History   Marital status: Married    Spouse name: Sahmi   Number of children: 1   Years of education: 16   Highest education level: Not on file  Occupational History   Occupation: stay at home mom  Tobacco Use    Smoking status: Never   Smokeless tobacco: Never  Vaping Use   Vaping Use: Never used  Substance and Sexual Activity   Alcohol use: Not Currently    Comment: occas   Drug use: Not Currently    Types: Marijuana    Comment: MJ use stopped at 31 years old   Sexual activity: Yes    Birth control/protection: None    Comment: family planning  Other Topics Concern   Not on file  Social History Narrative   Not on file   Social Determinants of Health   Financial Resource Strain: Low Risk  (10/28/2022)   Overall Financial Resource Strain (CARDIA)    Difficulty of Paying Living Expenses: Not very hard  Food Insecurity: No Food Insecurity (10/28/2022)   Hunger Vital Sign    Worried About Running Out of Food in the Last Year: Never true    Ran Out of Food in the Last Year: Never true  Transportation Needs: No Transportation Needs (10/28/2022)   PRAPARE - 13/07/2022 (Medical): No    Lack of Transportation (Non-Medical): No  Physical Activity: Inactive (10/28/2022)   Exercise Vital Sign    Days of Exercise per Week: 0 days    Minutes of Exercise  per Session: 0 min  Stress: No Stress Concern Present (10/28/2022)   [employer] of Occupational Health - Occupational Stress Questionnaire    Feeling of Stress : Only a little  Social Connections: Moderately Isolated (10/28/2022)   Social Connection and Isolation Panel [NHANES]    Frequency of Communication with Friends and Family: Once a week    Frequency of Social Gatherings with Friends and Family: More than three times a week    Attends Religious Services: Never    Database administrator or Organizations: No    Attends Banker Meetings: Never    Marital Status: Married  Catering manager Violence: Not At Risk (10/28/2022)   Humiliation, Afraid, Rape, and Kick questionnaire    Fear of Current or Ex-Partner: No    Emotionally Abused: No    Physically Abused: No    Sexually Abused: No    Family  History  Problem Relation Age of Onset   Healthy Mother    Esophageal cancer Father 88   Colitis Father    Healthy Sister    Stroke Maternal Grandmother    Atrial fibrillation Maternal Grandmother    Cancer Maternal Grandfather        lung   Cancer Paternal Grandfather 32       bladder   Dementia Paternal Grandfather     The following portions of the patient's history were reviewed and updated as appropriate: allergies, current medications, past OB history, past medical history, past surgical history, past family history, past social history, and problem list.    OBJECTIVE: Initial Physical Exam (New OB)  GENERAL APPEARANCE: alert, well appearing, in no apparent distress, oriented to person, place and time HEAD: normocephalic, atraumatic MOUTH: mucous membranes moist, pharynx normal without lesions THYROID: no thyromegaly or masses present BREASTS: no masses noted, no significant tenderness, no palpable axillary nodes, no skin changes LUNGS: clear to auscultation, no wheezes, rales or rhonchi, symmetric air entry HEART: regular rate and rhythm, no murmurs ABDOMEN: soft, nontender, nondistended, no abnormal masses, no epigastric pain and FHT present EXTREMITIES: no redness or tenderness in the calves or thighs, no edema, no limitation in range of motion, intact peripheral pulses SKIN: normal coloration and turgor, no rashes LYMPH NODES: no adenopathy palpable NEUROLOGIC: alert, oriented, normal speech, no focal findings or movement disorder noted  PELVIC EXAM deferred, tested pelvis, pap not due  ASSESSMENT: Normal pregnancy  PLAN: ew OB counseling: The patient has been given an overview regarding routine prenatal care. Recommendations regarding diet, weight gain, and exercise in pregnancy were given. Prenatal testing, optional genetic testing, carrier screening , and ultrasound use in pregnancy were reviewed.  Pt has completed Maternit 21-negative. Female fetus.  Benefits of  Breast Feeding were discussed. The patient is encouraged to consider nursing her baby post partum. Follow up 4 wks.  See orders  Doreene Burke, CNM

## 2022-12-02 NOTE — Patient Instructions (Signed)
Common Medications Safe in Pregnancy  Acne:      Constipation:  Benzoyl Peroxide     Colace  Clindamycin      Dulcolax Suppository  Topica Erythromycin     Fibercon  Salicylic Acid      Metamucil         Miralax AVOID:        Senakot   Accutane    Cough:  Retin-A       Cough Drops  Tetracycline      Phenergan w/ Codeine if Rx  Minocycline      Robitussin (Plain & DM)  Antibiotics:     Crabs/Lice:  Ceclor       RID  Cephalosporins    AVOID:  E-Mycins      Kwell  Keflex  Macrobid/Macrodantin   Diarrhea:  Penicillin      Kao-Pectate  Zithromax      Imodium AD         PUSH FLUIDS AVOID:       Cipro     Fever:  Tetracycline      Tylenol (Regular or Extra  Minocycline       Strength)  Levaquin      Extra Strength-Do not          Exceed 8 tabs/24 hrs Caffeine:        <200mg/day (equiv. To 1 cup of coffee or  approx. 3 12 oz sodas)         Gas: Cold/Hayfever:       Gas-X  Benadryl      Mylicon  Claritin       Phazyme  **Claritin-D        Chlor-Trimeton    Headaches:  Dimetapp      ASA-Free Excedrin  Drixoral-Non-Drowsy     Cold Compress  Mucinex (Guaifenasin)     Tylenol (Regular or Extra  Sudafed/Sudafed-12 Hour     Strength)  **Sudafed PE Pseudoephedrine   Tylenol Cold & Sinus     Vicks Vapor Rub  Zyrtec  **AVOID if Problems With Blood Pressure         Heartburn: Avoid lying down for at least 1 hour after meals  Aciphex      Maalox     Rash:  Milk of Magnesia     Benadryl    Mylanta       1% Hydrocortisone Cream  Pepcid  Pepcid Complete   Sleep Aids:  Prevacid      Ambien   Prilosec       Benadryl  Rolaids       Chamomile Tea  Tums (Limit 4/day)     Unisom         Tylenol PM         Warm milk-add vanilla or  Hemorrhoids:       Sugar for taste  Anusol/Anusol H.C.  (RX: Analapram 2.5%)  Sugar Substitutes:  Hydrocortisone OTC     Ok in moderation  Preparation H      Tucks        Vaseline lotion applied to tissue with  wiping    Herpes:     Throat:  Acyclovir      Oragel  Famvir  Valtrex     Vaccines:         Flu Shot Leg Cramps:       *Gardasil  Benadryl      Hepatitis A         Hepatitis B Nasal Spray:         Pneumovax  Saline Nasal Spray     Polio Booster         Tetanus Nausea:       Tuberculosis test or PPD  Vitamin B6 25 mg TID   AVOID:    Dramamine      *Gardasil  Emetrol       Live Poliovirus  Ginger Root 250 mg QID    MMR (measles, mumps &  High Complex Carbs @ Bedtime    rebella)  Sea Bands-Accupressure    Varicella (Chickenpox)  Unisom 1/2 tab TID     *No known complications           If received before Pain:         Known pregnancy;   Darvocet       Resume series after  Lortab        Delivery  Percocet    Yeast:   Tramadol      Femstat  Tylenol 3      Gyne-lotrimin  Ultram       Monistat  Vicodin           MISC:         All Sunscreens           Hair Coloring/highlights          Insect Repellant's          (Including DEET)         Mystic Tans Prenatal Care Prenatal care is health care during pregnancy. It helps you and your unborn baby (fetus) stay as healthy as possible. Prenatal care may be provided by a midwife, a family practice doctor, a mid-level practitioner (nurse practitioner or physician assistant), or a childbirth and pregnancy doctor (obstetrician). How does this affect me? During pregnancy, you will be closely monitored for any new conditions that might develop. To lower your risk of pregnancy complications, you and your health care provider will talk about any underlying conditions you have. How does this affect my baby? Early and consistent prenatal care increases the chance that your baby will be healthy during pregnancy. Prenatal care lowers the risk that your baby will be: Born early (prematurely). Smaller than expected at birth (small for gestational age). What can I expect at the first prenatal care visit? Your first prenatal care visit will likely be the  longest. You should schedule your first prenatal care visit as soon as you know that you are pregnant. Your first visit is a good time to talk about any questions or concerns you have about pregnancy. Medical history At your visit, you and your health care provider will talk about your medical history, including: Any past pregnancies. Your family's medical history. Medical history of the baby's father. Any long-term (chronic) health conditions you have and how you manage them. Any surgeries or procedures you have had. Any current over-the-counter or prescription medicines, herbs, or supplements that you are taking. Other factors that could pose a risk to your baby, including: Exposure to harmful chemicals or radiation at work or at home. Any substance use, including tobacco, alcohol, and drug use. Your home setting and your stress levels, including: Exposure to abuse or violence. Household financial strain. Your daily health habits, including diet and exercise. Tests and screenings Your health care provider will: Measure your weight, height, and blood pressure. Do a physical exam, including a pelvic and breast exam. Perform blood tests and urine tests to check for: Urinary tract infection. Sexually transmitted infections (STIs). Low iron levels   in your blood (anemia). Blood type and certain proteins on red blood cells (Rh antibodies). Infections and immunity to viruses, such as hepatitis B and rubella. HIV (human immunodeficiency virus). Discuss your options for genetic screening. Tips about staying healthy Your health care provider will also give you information about how to keep yourself and your baby healthy, including: Nutrition and taking vitamins. Physical activity. How to manage pregnancy symptoms such as nausea and vomiting (morning sickness). Infections and substances that may be harmful to your baby and how to avoid them. Food safety. Dental  care. Working. Travel. Warning signs to watch for and when to call your health care provider. How often will I have prenatal care visits? After your first prenatal care visit, you will have regular visits throughout your pregnancy. The visit schedule is often as follows: Up to week 28 of pregnancy: once every 4 weeks. 28-36 weeks: once every 2 weeks. After 36 weeks: every week until delivery. Some women may have visits more or less often depending on any underlying health conditions and the health of the baby. Keep all follow-up and prenatal care visits. This is important. What happens during routine prenatal care visits? Your health care provider will: Measure your weight and blood pressure. Check for fetal heart sounds. Measure the height of your uterus in your abdomen (fundal height). This may be measured starting around week 20 of pregnancy. Check the position of your baby inside your uterus. Ask questions about your diet, sleeping patterns, and whether you can feel the baby move. Review warning signs to watch for and signs of labor. Ask about any pregnancy symptoms you are having and how you are dealing with them. Symptoms may include: Headaches. Nausea and vomiting. Vaginal discharge. Swelling. Fatigue. Constipation. Changes in your vision. Feeling persistently sad or anxious. Any discomfort, including back or pelvic pain. Bleeding or spotting. Make a list of questions to ask your health care provider at your routine visits. What tests might I have during prenatal care visits? You may have blood, urine, and imaging tests throughout your pregnancy, such as: Urine tests to check for glucose, protein, or signs of infection. Glucose tests to check for a form of diabetes that can develop during pregnancy (gestational diabetes mellitus). This is usually done around week 24 of pregnancy. Ultrasounds to check your baby's growth and development, to check for birth defects, and to  check your baby's well-being. These can also help to decide when you should deliver your baby. A test to check for group B strep (GBS) infection. This is usually done around week 36 of pregnancy. Genetic testing. This may include blood, fluid, or tissue sampling, or imaging tests, such as an ultrasound. Some genetic tests are done during the first trimester and some are done during the second trimester. What else can I expect during prenatal care visits? Your health care provider may recommend getting certain vaccines during pregnancy. These may include: A yearly flu shot (annual influenza vaccine). This is especially important if you will be pregnant during flu season. Tdap (tetanus, diphtheria, pertussis) vaccine. Getting this vaccine during pregnancy can protect your baby from whooping cough (pertussis) after birth. This vaccine may be recommended between weeks 27 and 36 of pregnancy. A COVID-19 vaccine. Later in your pregnancy, your health care provider may give you information about: Childbirth and breastfeeding classes. Choosing a health care provider for your baby. Umbilical cord banking. Breastfeeding. Birth control after your baby is born. The hospital labor and delivery unit and how to   set up a tour. Registering at the hospital before you go into labor. Where to find more information Office on Women's Health: womenshealth.gov American Pregnancy Association: americanpregnancy.org March of Dimes: marchofdimes.org Summary Prenatal care helps you and your baby stay as healthy as possible during pregnancy. Your first prenatal care visit will most likely be the longest. You will have visits and tests throughout your pregnancy to monitor your health and your baby's health. Bring a list of questions to your visits to ask your health care provider. Make sure to keep all follow-up and prenatal care visits. This information is not intended to replace advice given to you by your health care  provider. Make sure you discuss any questions you have with your health care provider. Document Revised: 09/19/2020 Document Reviewed: 09/19/2020 Elsevier Patient Education  2023 Elsevier Inc.  

## 2022-12-18 ENCOUNTER — Encounter: Payer: Self-pay | Admitting: Certified Nurse Midwife

## 2023-01-04 ENCOUNTER — Encounter: Payer: Self-pay | Admitting: Certified Nurse Midwife

## 2023-01-04 ENCOUNTER — Ambulatory Visit (INDEPENDENT_AMBULATORY_CARE_PROVIDER_SITE_OTHER): Payer: Managed Care, Other (non HMO) | Admitting: Certified Nurse Midwife

## 2023-01-04 VITALS — BP 119/75 | HR 99 | Wt 116.0 lb

## 2023-01-04 DIAGNOSIS — Z3A18 18 weeks gestation of pregnancy: Secondary | ICD-10-CM | POA: Diagnosis not present

## 2023-01-04 DIAGNOSIS — Z1379 Encounter for other screening for genetic and chromosomal anomalies: Secondary | ICD-10-CM

## 2023-01-04 DIAGNOSIS — Z3482 Encounter for supervision of other normal pregnancy, second trimester: Secondary | ICD-10-CM | POA: Diagnosis not present

## 2023-01-04 DIAGNOSIS — Z3480 Encounter for supervision of other normal pregnancy, unspecified trimester: Secondary | ICD-10-CM

## 2023-01-04 LAB — POCT URINALYSIS DIPSTICK OB
Bilirubin, UA: NEGATIVE
Blood, UA: NEGATIVE
Glucose, UA: NEGATIVE
Ketones, UA: NEGATIVE
Leukocytes, UA: NEGATIVE
Nitrite, UA: NEGATIVE
POC,PROTEIN,UA: NEGATIVE
Spec Grav, UA: 1.02 (ref 1.010–1.025)
Urobilinogen, UA: 0.2 E.U./dL
pH, UA: 7 (ref 5.0–8.0)

## 2023-01-04 NOTE — Progress Notes (Signed)
ROB doing well, feeling movement. Pt will return next week for AFP test. Discussed anatomy u/s , she verbalizes and agrees. Orders placed. She will return in 2 wks for u/s and ROB.   Philip Aspen, CNM

## 2023-01-04 NOTE — Patient Instructions (Signed)
Round Ligament Pain  The round ligaments are a pair of cord-like tissues that help support the uterus. They can become a source of pain during pregnancy as the ligaments soften and stretch as the baby grows. The pain usually begins in the second trimester (13-28 weeks) of pregnancy, and should only last for a few seconds when it occurs. However, the pain can come and go until the baby is delivered. The pain does not cause harm to the baby. Round ligament pain is usually a short, sharp, and pinching pain, but it can also be a dull, lingering, and aching pain. The pain is felt in the lower side of the abdomen or in the groin. It usually starts deep in the groin and moves up to the outside of the hip area. The pain may happen when you: Suddenly change position, such as quickly going from a sitting to standing position. Do physical activity. Cough or sneeze. Follow these instructions at home: Managing pain  When the pain starts, relax. Then, try any of these methods to help with the pain: Sit down. Flex your knees up to your abdomen. Lie on your side with one pillow under your abdomen and another pillow between your legs. Sit in a warm bath for 15-20 minutes or until the pain goes away. General instructions Watch your condition for any changes. Move slowly when you sit down or stand up. Stop or reduce your physical activities if they cause pain. Avoid long walks if they cause pain. Take over-the-counter and prescription medicines only as told by your health care provider. Keep all follow-up visits. This is important. Contact a health care provider if: Your pain does not go away with treatment. You feel pain in your back that you did not have before. Your medicine is not helping. You have a fever or chills. You have nausea or vomiting. You have diarrhea. You have pain when you urinate. Get help right away if: You have pain that is a rhythmic, cramping pain similar to labor pains. Labor  pains are usually 2 minutes apart, last for about 1 minute, and involve a bearing down feeling or pressure in your pelvis. You have vaginal bleeding. These symptoms may represent a serious problem that is an emergency. Do not wait to see if the symptoms will go away. Get medical help right away. Call your local emergency services (911 in the [place] Do not drive yourself to the hospital. Summary Round ligament pain is felt in the lower abdomen or groin. This pain usually begins in the second trimester (13-28 weeks) and should only last for a few seconds when it occurs. You may notice the pain when you suddenly change position, when you cough or sneeze, or during physical activity. Relaxing, flexing your knees to your abdomen, lying on one side, or taking a warm bath may help to get rid of the pain. Contact your health care provider if the pain does not go away. This information is not intended to replace advice given to you by your health care provider. Make sure you discuss any questions you have with your health care provider. Document Revised: 02/19/2021 Document Reviewed: 02/19/2021 Elsevier Patient Education  2023 Elsevier Inc.  

## 2023-01-18 ENCOUNTER — Other Ambulatory Visit: Payer: Managed Care, Other (non HMO)

## 2023-01-18 ENCOUNTER — Ambulatory Visit (INDEPENDENT_AMBULATORY_CARE_PROVIDER_SITE_OTHER): Payer: Managed Care, Other (non HMO) | Admitting: Certified Nurse Midwife

## 2023-01-18 VITALS — BP 103/66 | HR 74 | Wt 119.8 lb

## 2023-01-18 DIAGNOSIS — Z3A20 20 weeks gestation of pregnancy: Secondary | ICD-10-CM

## 2023-01-18 DIAGNOSIS — Z3482 Encounter for supervision of other normal pregnancy, second trimester: Secondary | ICD-10-CM

## 2023-01-18 DIAGNOSIS — Z3480 Encounter for supervision of other normal pregnancy, unspecified trimester: Secondary | ICD-10-CM

## 2023-01-18 DIAGNOSIS — Z1379 Encounter for other screening for genetic and chromosomal anomalies: Secondary | ICD-10-CM

## 2023-01-18 LAB — POCT URINALYSIS DIPSTICK OB
Bilirubin, UA: NEGATIVE
Blood, UA: NEGATIVE
Glucose, UA: NEGATIVE
Ketones, UA: NEGATIVE
Nitrite, UA: NEGATIVE
POC,PROTEIN,UA: NEGATIVE
Spec Grav, UA: 1.015 (ref 1.010–1.025)
Urobilinogen, UA: 0.2 E.U./dL
pH, UA: 6.5 (ref 5.0–8.0)

## 2023-01-18 NOTE — Progress Notes (Signed)
ROB doing well, has no questions or concerns today. She is feeling regular fetal movement. She has her ultrasound for anatomy scheduled. Reviewed round ligament pain and encouraged use of belly band as needed. Follow up 4 ws for ROB.   Philip Aspen, CNM

## 2023-01-20 LAB — AFP, SERUM, OPEN SPINA BIFIDA
AFP MoM: 0.62
AFP Value: 42.5 ng/mL
Gest. Age on Collection Date: 20 weeks
Maternal Age At EDD: 30.4 yr
OSBR Risk 1 IN: 10000
Test Results:: NEGATIVE
Weight: 116 [lb_av]

## 2023-02-02 ENCOUNTER — Ambulatory Visit
Admission: RE | Admit: 2023-02-02 | Discharge: 2023-02-02 | Disposition: A | Discharge status: Home / Self Care | Payer: Managed Care, Other (non HMO) | Source: Ambulatory Visit | Attending: Certified Nurse Midwife | Admitting: Certified Nurse Midwife

## 2023-02-02 DIAGNOSIS — Z3480 Encounter for supervision of other normal pregnancy, unspecified trimester: Secondary | ICD-10-CM

## 2023-02-02 DIAGNOSIS — O321XX0 Maternal care for breech presentation, not applicable or unspecified: Secondary | ICD-10-CM | POA: Diagnosis not present

## 2023-02-02 DIAGNOSIS — Z3A22 22 weeks gestation of pregnancy: Secondary | ICD-10-CM | POA: Diagnosis not present

## 2023-02-17 ENCOUNTER — Encounter: Payer: Self-pay | Admitting: Certified Nurse Midwife

## 2023-02-17 ENCOUNTER — Ambulatory Visit (INDEPENDENT_AMBULATORY_CARE_PROVIDER_SITE_OTHER): Payer: Managed Care, Other (non HMO) | Admitting: Certified Nurse Midwife

## 2023-02-17 VITALS — BP 110/72 | HR 86 | Wt 126.9 lb

## 2023-02-17 DIAGNOSIS — Z3A24 24 weeks gestation of pregnancy: Secondary | ICD-10-CM | POA: Diagnosis not present

## 2023-02-17 DIAGNOSIS — Z3482 Encounter for supervision of other normal pregnancy, second trimester: Secondary | ICD-10-CM

## 2023-02-17 DIAGNOSIS — Z3480 Encounter for supervision of other normal pregnancy, unspecified trimester: Secondary | ICD-10-CM

## 2023-02-17 LAB — POCT URINALYSIS DIPSTICK OB
Blood, UA: NEGATIVE
Glucose, UA: NEGATIVE
Ketones, UA: NEGATIVE
Nitrite, UA: NEGATIVE
Spec Grav, UA: 1.015 (ref 1.010–1.025)
Urobilinogen, UA: 0.2 E.U./dL
pH, UA: 6.5 (ref 5.0–8.0)

## 2023-02-17 NOTE — Progress Notes (Signed)
ROB doing well, Feeling good movement. This is her last visit with [place] as she is moving. She has a [facility] provider established and will contact them to schedule her next appointment for her 1 hr glucose screen. Follow up 4 wks at new practice for 1 hr glucose and ROB.   Philip Aspen, CNM

## 2023-02-17 NOTE — Patient Instructions (Signed)
Oral Glucose Tolerance Test During Pregnancy Why am I having this test? The oral glucose tolerance test (OGTT) is done to check how your body processes blood sugar (glucose). This is one of several tests used to diagnose diabetes that develops during pregnancy (gestational diabetes mellitus). Gestational diabetes is a short-term form of diabetes that some women develop while they are pregnant. It usually occurs during the second trimester of pregnancy and goes away after delivery. Testing, or screening, for gestational diabetes usually occurs at weeks 24-28 of pregnancy. You may have the OGTT test after having a 1-hour glucose screening test if the results from that test indicate that you may have gestational diabetes. This test may also be needed if: You have a history of gestational diabetes. There is a history of giving birth to very large babies or of losing pregnancies (having stillbirths). You have signs and symptoms of diabetes, such as: Changes in your eyesight. Tingling or numbness in your hands or feet. Changes in hunger, thirst, and urination, and these are not explained by your pregnancy. What is being tested? This test measures the amount of glucose in your blood at different times during a period of 3 hours. This shows how well your body can process glucose. What kind of sample is taken?  Blood samples are required for this test. They are usually collected by inserting a needle into a blood vessel. How do I prepare for this test? For 3 days before your test, eat normally. Have plenty of carbohydrate-rich foods. Follow instructions from your health care provider about: Eating or drinking restrictions on the day of the test. You may be asked not to eat or drink anything other than water (to fast) starting 8-10 hours before the test. Changing or stopping your regular medicines. Some medicines may interfere with this test. Tell a health care provider about: All medicines you are  taking, including vitamins, herbs, eye drops, creams, and over-the-counter medicines. Any blood disorders you have. Any surgeries you have had. Any medical conditions you have. What happens during the test? First, your blood glucose will be measured. This is referred to as your fasting blood glucose because you fasted before the test. Then, you will drink a glucose solution that contains a certain amount of glucose. Your blood glucose will be measured again 1, 2, and 3 hours after you drink the solution. This test takes about 3 hours to complete. You will need to stay at the testing location during this time. During the testing period: Do not eat or drink anything other than the glucose solution. Do not exercise. Do not use any products that contain nicotine or tobacco, such as cigarettes, e-cigarettes, and chewing tobacco. These can affect your test results. If you need help quitting, ask your health care provider. The testing procedure may vary among health care providers and hospitals. How are the results reported? Your results will be reported as milligrams of glucose per deciliter of blood (mg/dL) or millimoles per liter (mmol/L). There is more than one source for screening and diagnosis reference values used to diagnose gestational diabetes. Your health care provider will compare your results to normal values that were established after testing a large group of people (reference values). Reference values may vary among labs and hospitals. For this test ([facility]oustan), reference values are: Fasting: 95 mg/dL (5.3 mmol/L). 1 hour: 180 mg/dL (10.0 mmol/L). 2 hour: 155 mg/dL (8.6 mmol/L). 3 hour: 140 mg/dL (7.8 mmol/L). What do the results mean? Results below the reference values are  considered normal. If two or more of your blood glucose levels are at or above the reference values, you may be diagnosed with gestational diabetes. If only one level is high, your health care provider may  suggest repeat testing or other tests to confirm a diagnosis. Talk with your health care provider about what your results mean. Questions to ask your health care provider Ask your health care provider, or the department that is doing the test: When will my results be ready? How will I get my results? What are my treatment options? What other tests do I need? What are my next steps? Summary The oral glucose tolerance test (OGTT) is one of several tests used to diagnose diabetes that develops during pregnancy (gestational diabetes mellitus). Gestational diabetes is a short-term form of diabetes that some women develop while they are pregnant. You may have the OGTT test after having a 1-hour glucose screening test if the results from that test show that you may have gestational diabetes. You may also have this test if you have any symptoms or risk factors for this type of diabetes. Talk with your health care provider about what your results mean. This information is not intended to replace advice given to you by your health care provider. Make sure you discuss any questions you have with your health care provider. Document Revised: 07/14/2022 Document Reviewed: 05/16/2020 [employer] Patient Education  [place].

## 2023-02-26 IMAGING — US US OB COMP LESS 14 WK
1 series · 14 of 28 positions shown · non-contrast
Comparison: None.

CLINICAL DATA: Dating and viability

EXAM:
OBSTETRIC <14 WK ULTRASOUND
TECHNIQUE: Transabdominal ultrasound was performed for evaluation of the
gestation as well as the maternal uterus and adnexal regions.

[Series 1: us ob less than 14 weeks with ob transvaginal · 14 of 72 slices shown]
[im 3/72]
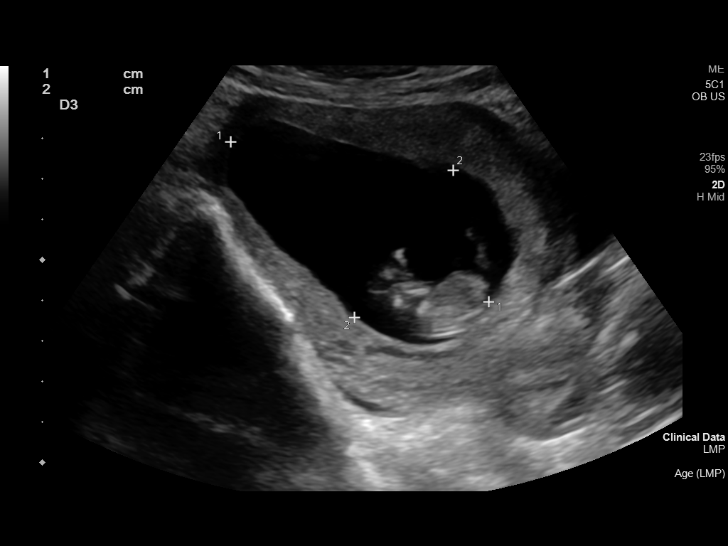
[im 8/72]
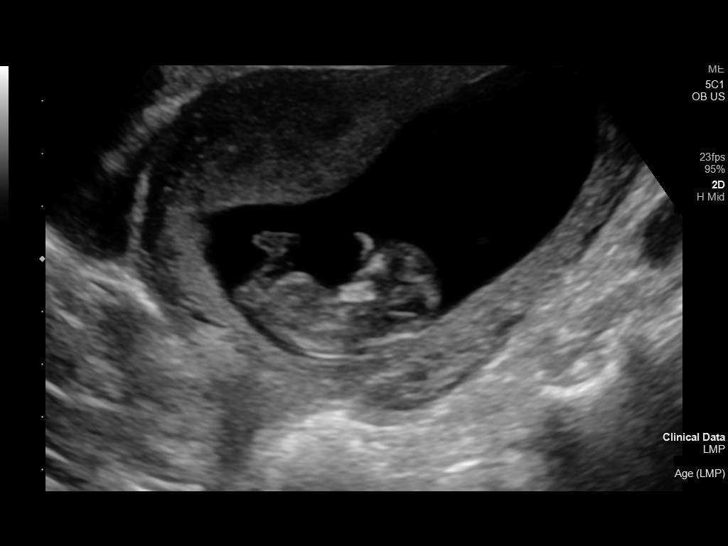
[im 14/72]
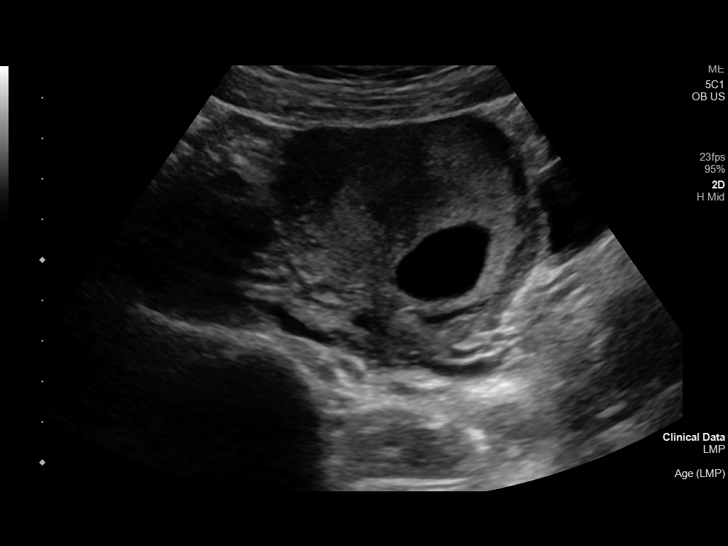
[im 19/72]
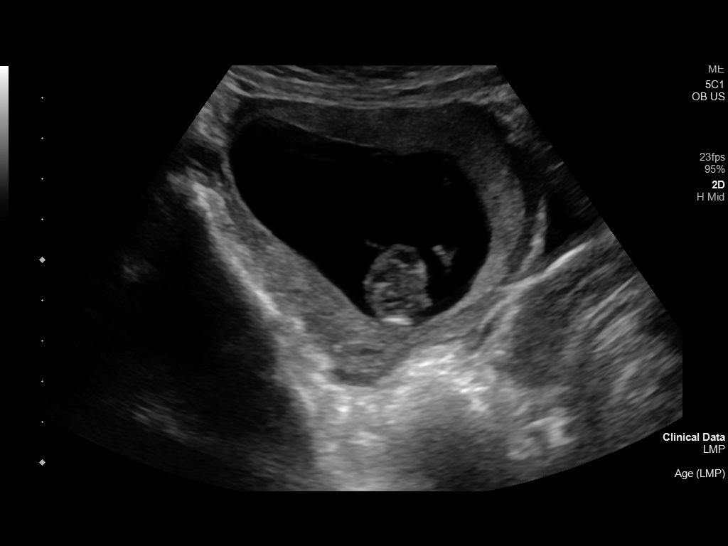
[im 24/72]
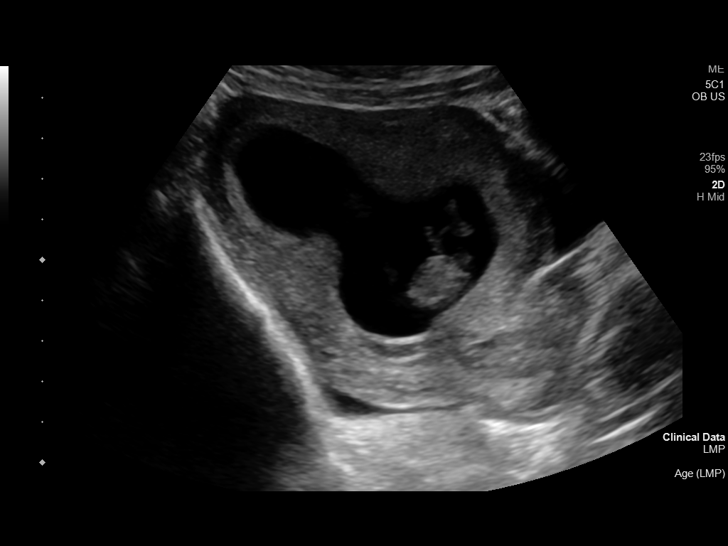
[im 29/72]
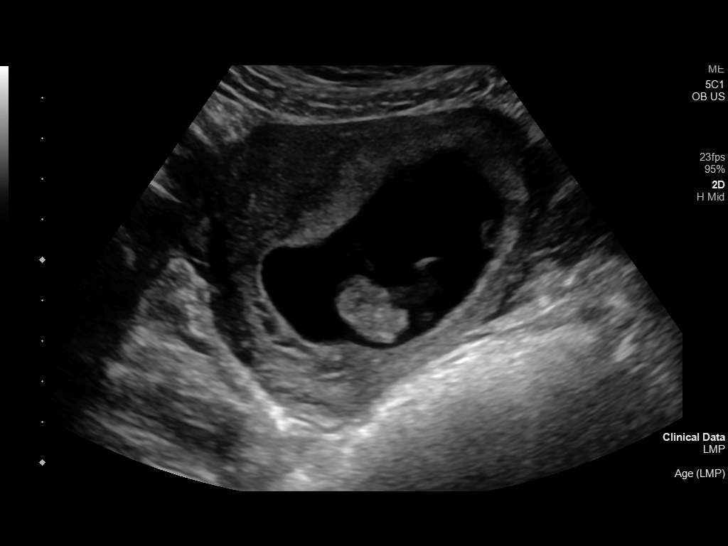
[im 35/72]
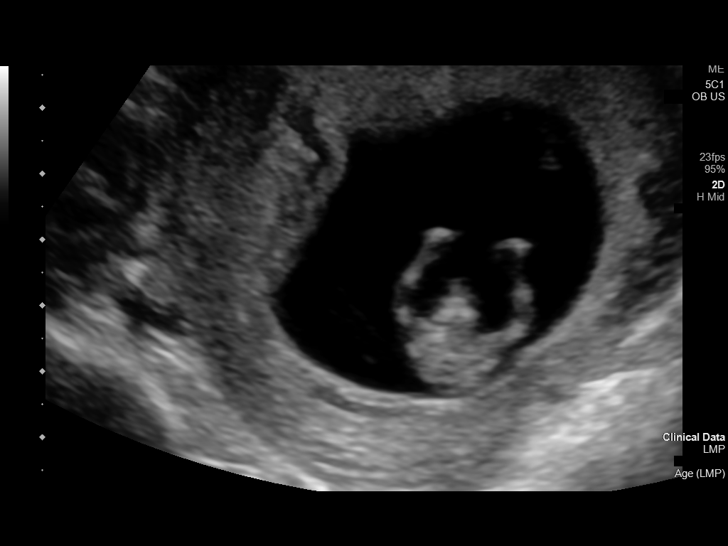
[im 40/72]
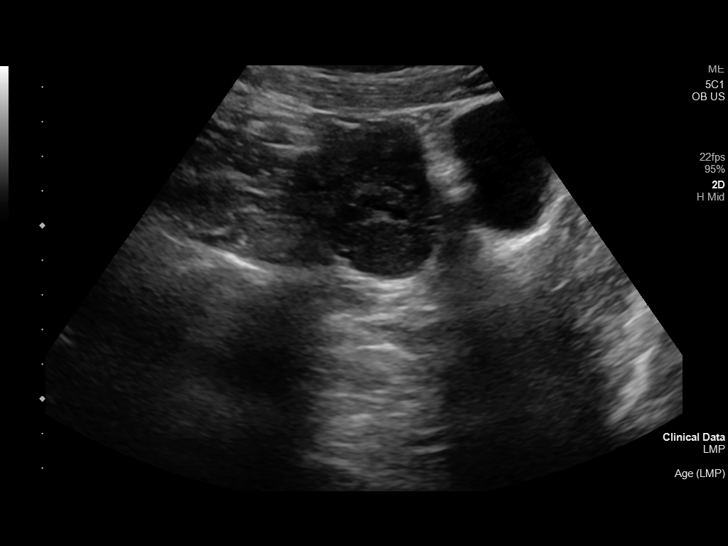
[im 45/72]
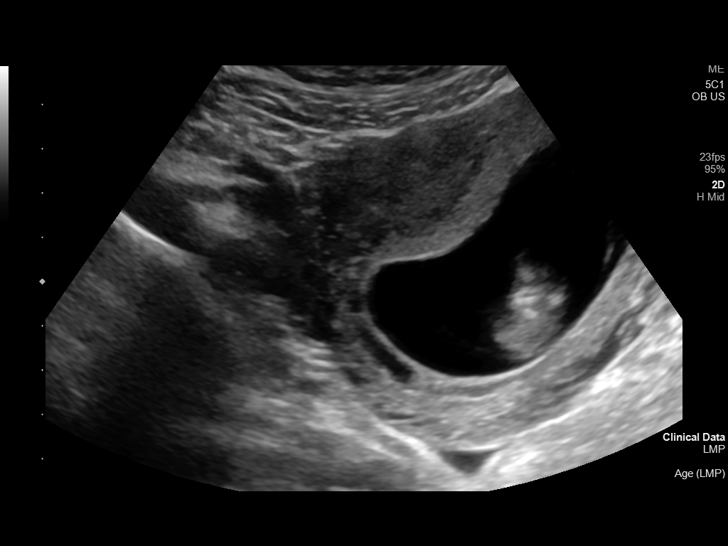
[im 50/72]
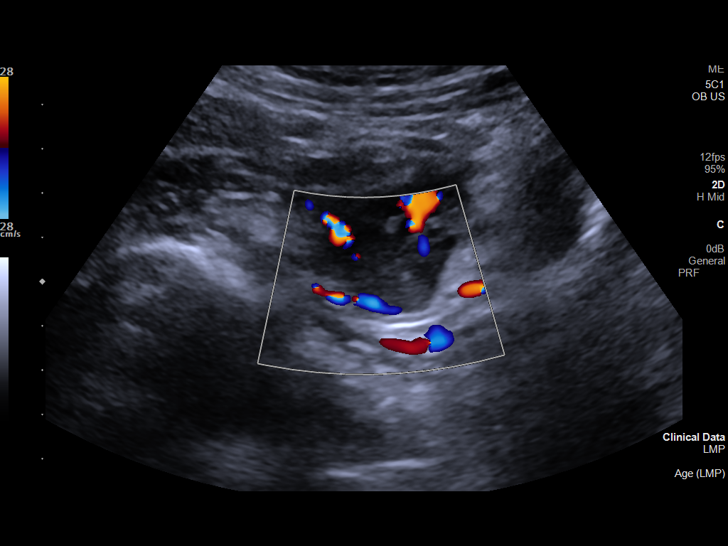
[im 56/72]
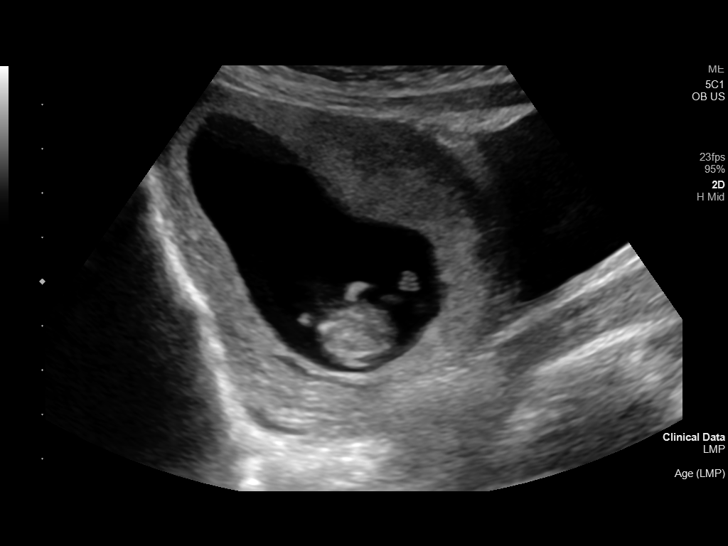
[im 61/72]
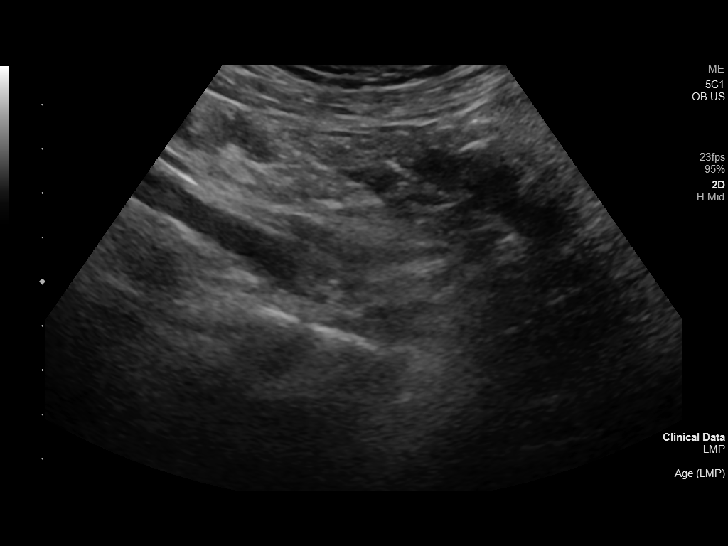
[im 66/72]
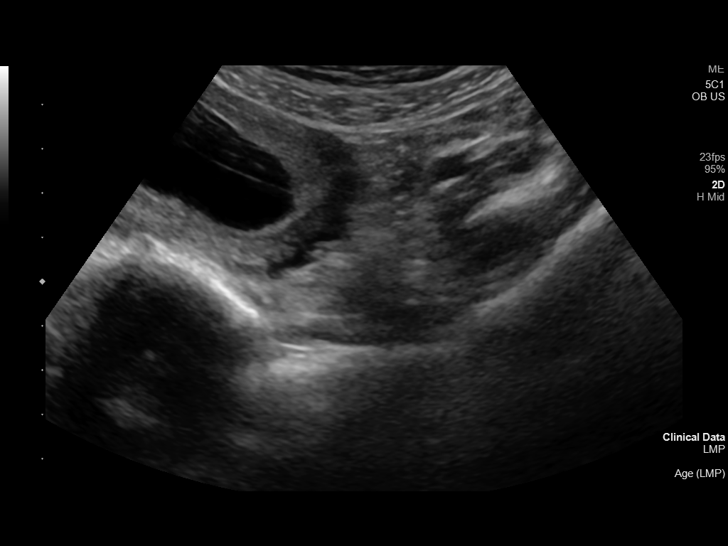
[im 72/72]
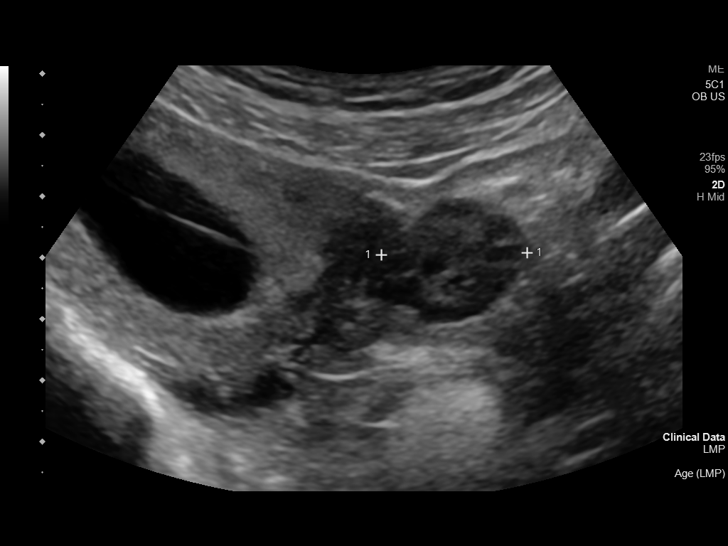

[14 of 28 positions shown; findings below may reference images not displayed]

FINDINGS: Intrauterine gestational sac: Single

Yolk sac:  Visualized

Embryo:  Visualized

Cardiac Activity: Visualized

Heart Rate: 167 bpm

CRL: 41 mm   11 w 0 d                  US EDC: 11/21/2021

Subchorionic hemorrhage:  None visualized.

Maternal uterus/adnexae: Ovaries are within normal limits. Right
ovary measures 2.7 x 1.7 x 1.4 cm. Left ovary measures 2.6 x 1.8 x
2.4 cm. No significant free fluid
IMPRESSION: Single viable intrauterine pregnancy with estimated sonographic age
of 11 weeks 0 day and ultrasound EDC of 11/21/2021.

## 2023-04-28 IMAGING — US US OB COMP +14 WK
1 series · 13 of 28 positions shown · non-contrast
Comparison: none

CLINICAL DATA: Pregnancy.  Assess fetal anatomy.

EXAM:
OBSTETRICAL ULTRASOUND >14 WKS

[Series 1: us ob comp + 14 wk · 13 of 74 slices shown]
[im 3/74]
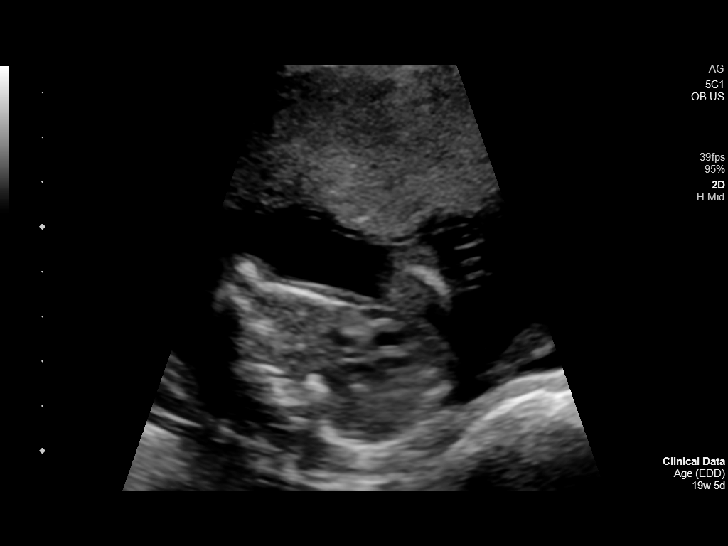
[im 9/74]
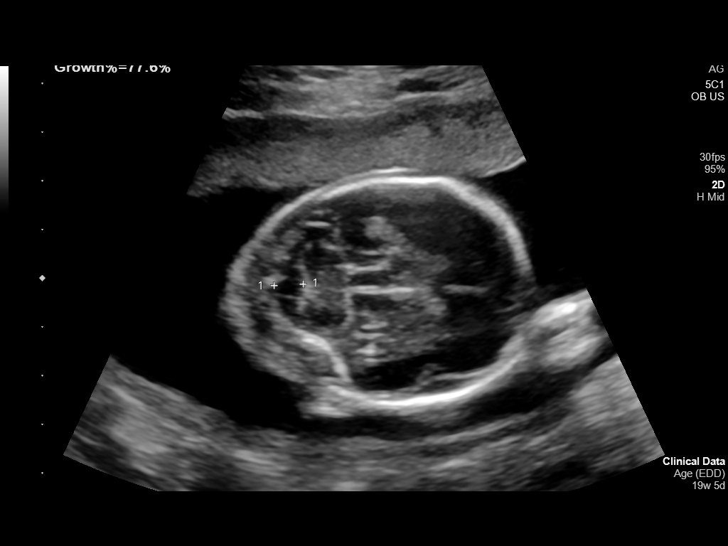
[im 14/74]
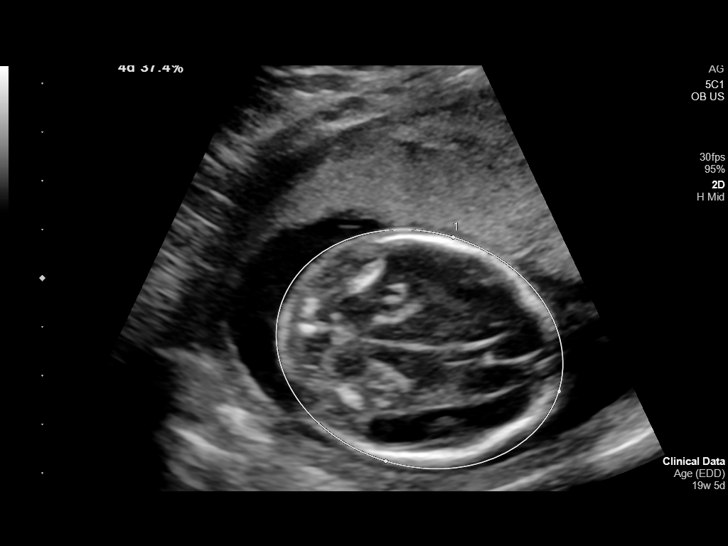
[im 19/74]
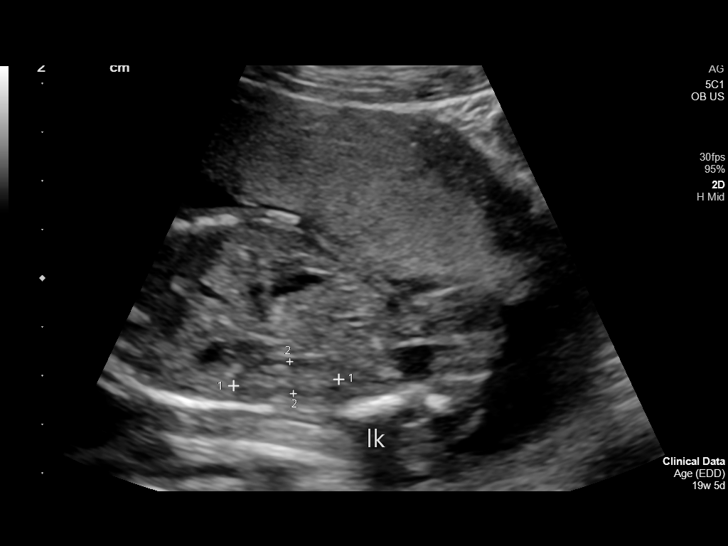
[im 25/74]
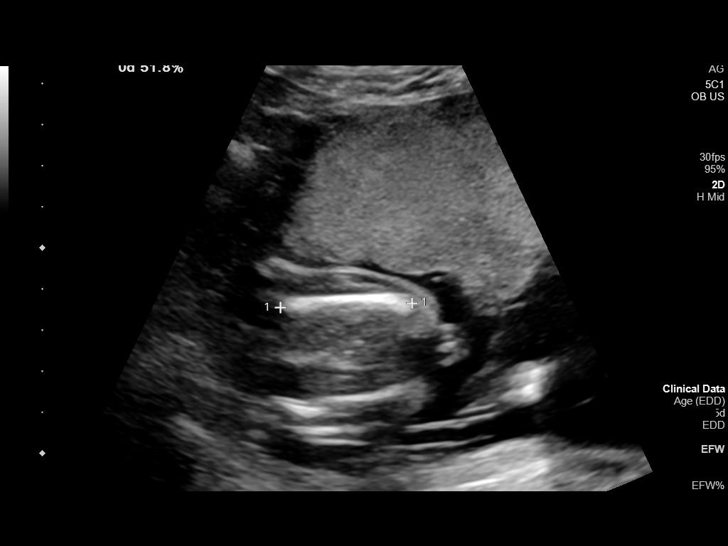
[im 30/74]
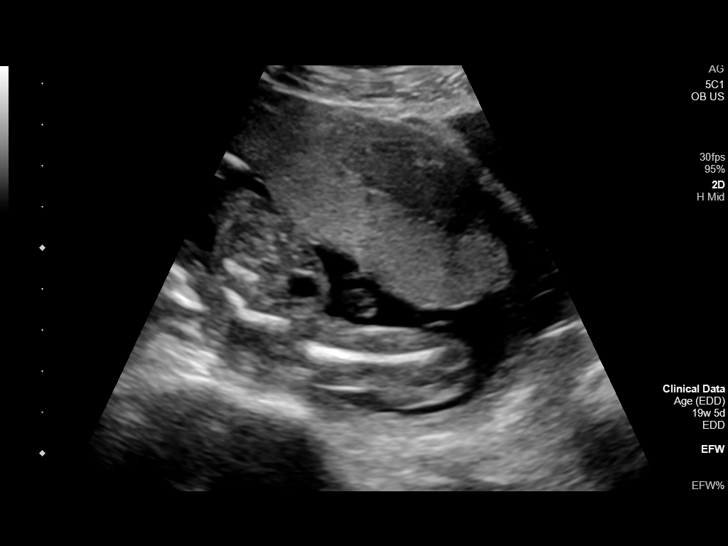
[im 38/74]
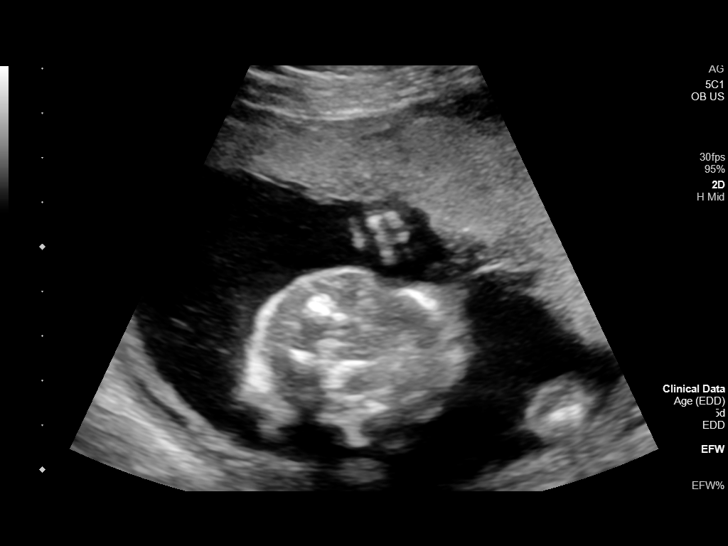
[im 44/74]
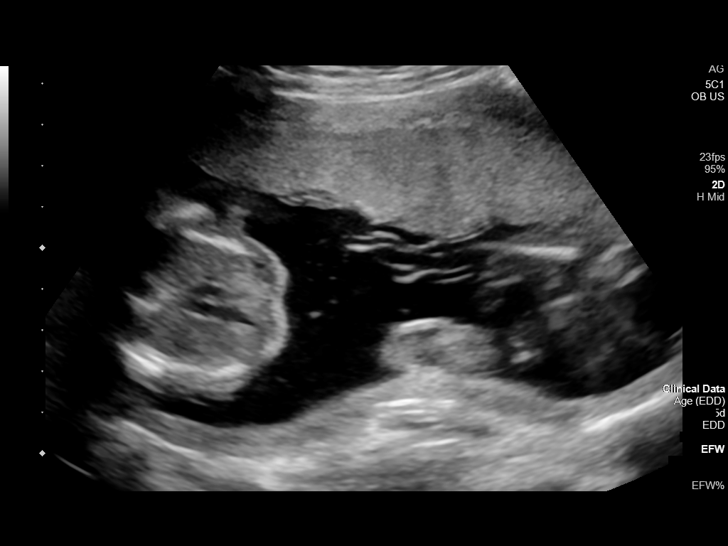
[im 49/74]
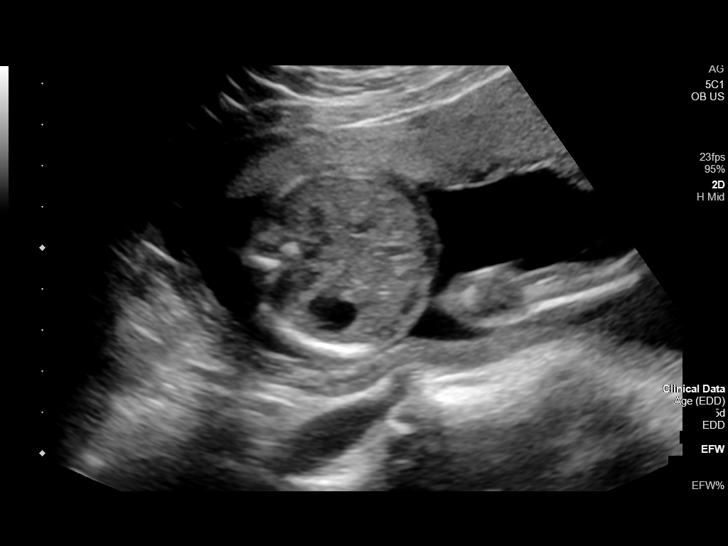
[im 55/74]
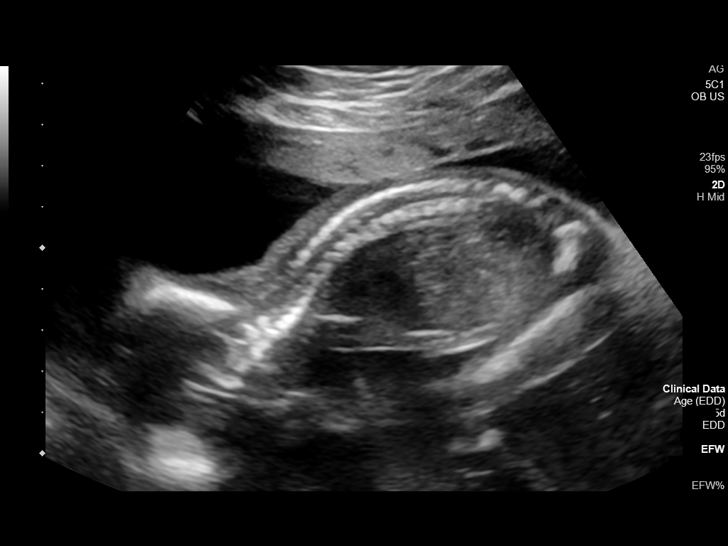
[im 60/74]
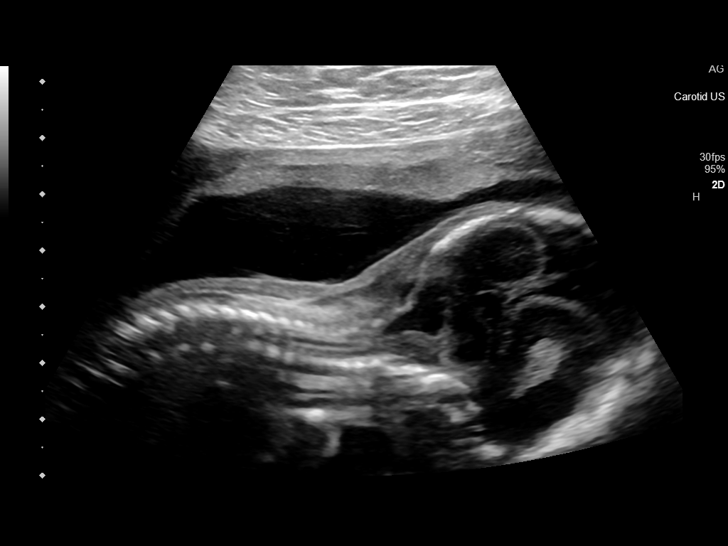
[im 65/74]
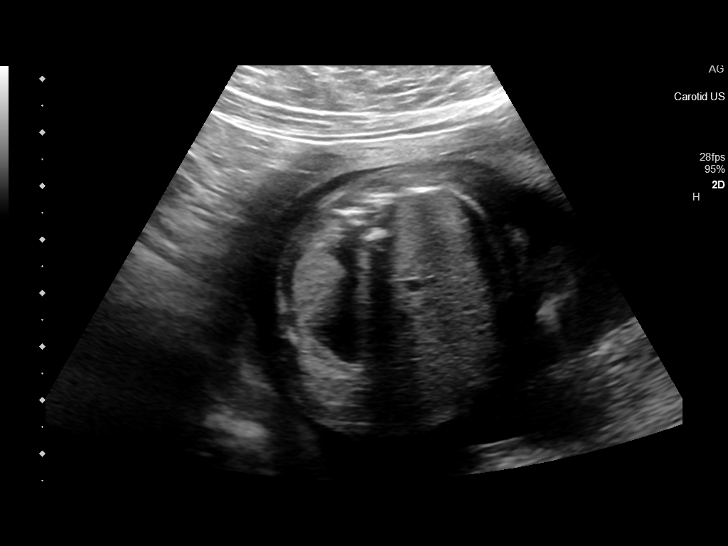
[im 71/74]
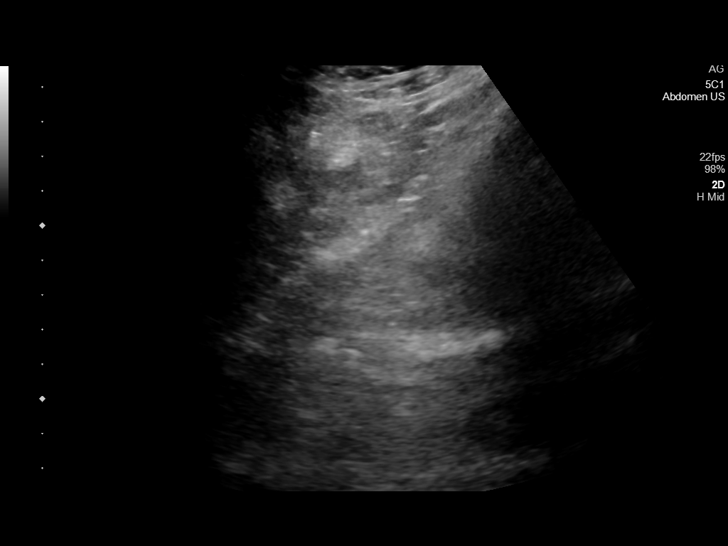

[13 of 28 positions shown; findings below may reference images not displayed]

FINDINGS: Number of Fetuses: 1

Heart Rate:  150 bpm

Movement: Yes

Presentation: Breech

Previa: No

Placental Location: Posterior

Amniotic Fluid (Subjective): Normal

Amniotic Fluid (Objective):

Vertical pocket = 5.3cm

FETAL BIOMETRY

BPD: 4.6cm 19w 6d

HC:   17.0cm 19w 4d

AC:   15.1cm 20w 2d

FL:   3.2cm 20w 0d

Current Mean GA: 20w 0d US EDC: 11/19/2021

Assigned GA:  19w 5d Assigned EDC: 11/21/2021

Estimated Fetal Weight:  331g 67%ile

FETAL ANATOMY

Lateral Ventricles: Appears normal

Thalami/CSP: Appears normal

Posterior Fossa:  Appears normal

Nuchal Region: Appears normal   NFT= 4 mm

Upper Lip: Appears normal

Spine: Appears normal

4 Chamber Heart on Left: Appears normal

LVOT: Appears normal

RVOT: Appears normal

Stomach on Left: Appears normal

3 Vessel Cord: Appears normal

Cord Insertion site: Appears normal

Kidneys: Appears normal

Bladder: Appears normal

Extremities: Appears normal

Technically difficult due to: None

Maternal Findings:

Cervix:  Closed.  3.5 cm
IMPRESSION: 1. Single live intrauterine gestation in breech presentation.
2. Assigned gestational age of 19 weeks 5 days. Adequate interval
growth.
3. Complete fetal anatomic survey.  No fetal anomaly detected.

## 2023-06-02 ENCOUNTER — Encounter: Payer: Self-pay | Admitting: Certified Nurse Midwife
